# Patient Record
Sex: Female | Born: 2000 | Race: Black or African American | Hispanic: No | Marital: Single | Smoking: Never smoker
Health system: Southern US, Community
[De-identification: ages and names within clinical notes are randomized; demographics above are authoritative.]

## PROBLEM LIST (undated history)

## (undated) ENCOUNTER — Emergency Department (HOSPITAL_COMMUNITY): Payer: Self-pay | Source: Home / Self Care

## (undated) ENCOUNTER — Emergency Department (HOSPITAL_COMMUNITY): Admission: EM | Payer: Self-pay | Source: Home / Self Care

## (undated) DIAGNOSIS — R350 Frequency of micturition: Secondary | ICD-10-CM

## (undated) DIAGNOSIS — H539 Unspecified visual disturbance: Secondary | ICD-10-CM

## (undated) DIAGNOSIS — J45909 Unspecified asthma, uncomplicated: Secondary | ICD-10-CM

## (undated) DIAGNOSIS — R2689 Other abnormalities of gait and mobility: Secondary | ICD-10-CM

## (undated) DIAGNOSIS — L309 Dermatitis, unspecified: Secondary | ICD-10-CM

## (undated) DIAGNOSIS — K219 Gastro-esophageal reflux disease without esophagitis: Secondary | ICD-10-CM

## (undated) DIAGNOSIS — R55 Syncope and collapse: Secondary | ICD-10-CM

## (undated) DIAGNOSIS — E875 Hyperkalemia: Secondary | ICD-10-CM

## (undated) HISTORY — DX: Unspecified visual disturbance: H53.9

## (undated) HISTORY — DX: Other abnormalities of gait and mobility: R26.89

## (undated) HISTORY — DX: Unspecified asthma, uncomplicated: J45.909

## (undated) HISTORY — DX: Frequency of micturition: R35.0

## (undated) HISTORY — DX: Hyperkalemia: E87.5

## (undated) HISTORY — DX: Dermatitis, unspecified: L30.9

## (undated) HISTORY — DX: Syncope and collapse: R55

---
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
## 2018-06-07 ENCOUNTER — Emergency Department (HOSPITAL_COMMUNITY)
Admission: EM | Admit: 2018-06-07 | Discharge: 2018-06-07 | Disposition: A | Discharge status: Home / Self Care | Payer: 59 | Attending: Emergency Medicine | Admitting: Emergency Medicine

## 2018-06-07 ENCOUNTER — Emergency Department (HOSPITAL_COMMUNITY): Payer: 59

## 2018-06-07 ENCOUNTER — Encounter (HOSPITAL_COMMUNITY): Payer: Self-pay | Admitting: Emergency Medicine

## 2018-06-07 ENCOUNTER — Other Ambulatory Visit: Payer: Self-pay

## 2018-06-07 DIAGNOSIS — J45909 Unspecified asthma, uncomplicated: Secondary | ICD-10-CM | POA: Diagnosis not present

## 2018-06-07 DIAGNOSIS — R197 Diarrhea, unspecified: Secondary | ICD-10-CM | POA: Diagnosis present

## 2018-06-07 DIAGNOSIS — E876 Hypokalemia: Secondary | ICD-10-CM | POA: Diagnosis not present

## 2018-06-07 HISTORY — DX: Gastro-esophageal reflux disease without esophagitis: K21.9

## 2018-06-07 LAB — CBC
HEMATOCRIT: 37 % (ref 36.0–46.0)
Hemoglobin: 12.1 g/dL (ref 12.0–15.0)
MCH: 28.5 pg (ref 26.0–34.0)
MCHC: 32.7 g/dL (ref 30.0–36.0)
MCV: 87.1 fL (ref 78.0–100.0)
PLATELETS: 271 10*3/uL (ref 150–400)
RBC: 4.25 MIL/uL (ref 3.87–5.11)
RDW: 13.1 % (ref 11.5–15.5)
WBC: 6.4 10*3/uL (ref 4.0–10.5)

## 2018-06-07 LAB — I-STAT BETA HCG BLOOD, ED (MC, WL, AP ONLY): I-stat hCG, quantitative: <5 m[IU]/mL

## 2018-06-07 LAB — COMPREHENSIVE METABOLIC PANEL
ALT: 35 U/L (ref 14–54)
AST: 24 U/L (ref 15–41)
Albumin: 4.3 g/dL (ref 3.5–5.0)
Alkaline Phosphatase: 71 U/L (ref 38–126)
Anion gap: 9 (ref 5–15)
BUN: 7 mg/dL (ref 6–20)
CHLORIDE: 108 mmol/L (ref 101–111)
CO2: 23 mmol/L (ref 22–32)
Calcium: 8.8 mg/dL — ABNORMAL LOW (ref 8.9–10.3)
Creatinine, Ser: 0.54 mg/dL (ref 0.44–1.00)
GFR calc Af Amer: >60 mL/min (ref >60)
GFR calc non Af Amer: >60 mL/min (ref >60)
Glucose, Bld: 87 mg/dL (ref 65–99)
POTASSIUM: 3.1 mmol/L — AB (ref 3.5–5.1)
Sodium: 140 mmol/L (ref 135–145)
Total Bilirubin: 1 mg/dL (ref 0.3–1.2)
Total Protein: 7.6 g/dL (ref 6.5–8.1)

## 2018-06-07 LAB — LIPASE, BLOOD: Lipase: 25 U/L (ref 11–51)

## 2018-06-07 LAB — URINALYSIS, ROUTINE W REFLEX MICROSCOPIC
Bilirubin Urine: NEGATIVE
Glucose, UA: NEGATIVE mg/dL
Hgb urine dipstick: NEGATIVE
Ketones, ur: NEGATIVE mg/dL
Leukocytes, UA: NEGATIVE
Nitrite: NEGATIVE
Protein, ur: NEGATIVE mg/dL
Specific Gravity, Urine: 1.018 (ref 1.005–1.030)
pH: 5 (ref 5.0–8.0)

## 2018-06-07 LAB — MAGNESIUM: Magnesium: 2.1 mg/dL (ref 1.7–2.4)

## 2018-06-07 MED ORDER — CIPROFLOXACIN HCL 500 MG PO TABS: 500.0000 mg | ORAL_TABLET | Freq: Two times a day (BID) | ORAL | 0 refills | Status: DC

## 2018-06-07 MED ORDER — POTASSIUM CHLORIDE CRYS ER 20 MEQ PO TBCR
40.0000 meq | EXTENDED_RELEASE_TABLET | Freq: Once | ORAL | Status: AC
Start: 2018-06-07 — End: 2018-06-07
  Administered 2018-06-07: 40 meq via ORAL
  Filled 2018-06-07: qty 2

## 2018-06-07 MED ORDER — KETOROLAC TROMETHAMINE 30 MG/ML IJ SOLN
30.0000 mg | Freq: Once | INTRAMUSCULAR | Status: AC
  Administered 2018-06-07: 30 mg via INTRAVENOUS
  Filled 2018-06-07: qty 1

## 2018-06-07 MED ORDER — METRONIDAZOLE 500 MG PO TABS: 500.0000 mg | ORAL_TABLET | Freq: Two times a day (BID) | ORAL | 0 refills | Status: DC

## 2018-06-07 MED ORDER — SODIUM CHLORIDE 0.9 % IV BOLUS
1000.0000 mL | Freq: Once | INTRAVENOUS | Status: AC
  Administered 2018-06-07: 1000 mL via INTRAVENOUS

## 2018-06-07 NOTE — Discharge Instructions (Signed)
Take antibiotics ONLY if diarrhea has not gone away by tomorrow.

## 2018-06-07 NOTE — ED Provider Notes (Signed)
[facility] DEPT Provider Note   CSN: [id number] Arrival date & time: 06/07/18  0944     History   Chief Complaint Chief Complaint  Patient presents with  . Abdominal Pain    HPI Kari Carrillo is a 17 y.o. female.  Pt presents to the ED today with diarrhea and abdominal pain.  The pt said sx started 2 days ago.  She said it is like water is coming out of her bottom.  She tried some pepto bismol which did not help, but her stool was black after taking that.  She c/o mild upper abdominal pain.  She has tolerated po fluids.  Pt also c/o right hip tenderness.     Past Medical History:  Diagnosis Date  . Asthma   . GERD (gastroesophageal reflux disease)     There are no active problems to display for this patient.   History reviewed. No pertinent surgical history.   OB History   None      Home Medications    Prior to Admission medications   Medication Sig Start Date End Date Taking? Authorizing Provider  acetaminophen (TYLENOL) 500 MG tablet Take 1,000 mg by mouth every 6 (six) hours as needed.   Yes [provider]  albuterol (PROVENTIL HFA;VENTOLIN HFA) 108 (90 Base) MCG/ACT inhaler Inhale 1-2 puffs into the lungs every 4 (four) hours as needed for wheezing or shortness of breath. 01/21/16  Yes Harris, Abigail, PA-C  ibuprofen (ADVIL) 200 MG tablet Take 400-600 mg by mouth every 6 (six) hours as needed for moderate pain.   Yes [provider]  ciprofloxacin (CIPRO) 500 MG tablet Take 1 tablet (500 mg total) by mouth 2 (two) times daily. 06/07/18   Isla Pence, MD  EPINEPHrine 0.3 mg/0.3 mL IJ SOAJ injection Inject 0.3 mg into the muscle once as needed (allergic reaction).     [provider]  famotidine (PEPCID) 20 MG tablet Take 1 tablet (20 mg total) by mouth 2 (two) times daily. Patient not taking: Reported on 06/07/2018 09/09/16   Nona Dell, PA-C  guaiFENesin-codeine 100-10 MG/5ML syrup Take  5-10 mLs by mouth every 6 (six) hours as needed for cough. Patient not taking: Reported on 09/09/2016 01/21/16   Margarita Mail, PA-C  metroNIDAZOLE (FLAGYL) 500 MG tablet Take 1 tablet (500 mg total) by mouth 2 (two) times daily. 06/07/18   Isla Pence, MD  predniSONE (DELTASONE) 20 MG tablet 3 tabs po daily x 3 days, then 2 tabs x 3 days, then 1.5 tabs x 3 days, then 1 tab x 3 days, then 0.5 tabs x 3 days Patient not taking: Reported on 09/09/2016 01/21/16   Margarita Mail, PA-C    Family History Family History  Problem Relation Age of Onset  . Diabetes Father   . Multiple sclerosis Father     Social History Social History   Tobacco Use  . Smoking status: Never Smoker  . Smokeless tobacco: Never Used  Substance Use Topics  . Alcohol use: No  . Drug use: No     Allergies   Other and Other   Review of Systems Review of Systems  Gastrointestinal: Positive for abdominal pain and diarrhea.  All other systems reviewed and are negative.    Physical Exam Updated Vital Signs BP 102/67   Pulse 75   Temp 98.6 F (37 C) (Oral)   Resp 18   Ht 5\' 7"  (1.702 m)   Wt 81.6 kg (180 lb)   LMP  05/17/2018 (Exact Date)   SpO2 100%   BMI 28.19 kg/m   Physical Exam  Constitutional: She is oriented to person, place, and time. She appears well-developed and well-nourished.  HENT:  Head: Normocephalic and atraumatic.  Mouth/Throat: Mucous membranes are dry.  Eyes: Pupils are equal, round, and reactive to light. EOM are normal.  Cardiovascular: Normal rate and regular rhythm.  Pulmonary/Chest: Effort normal and breath sounds normal.  Abdominal: Normal appearance, normal aorta and bowel sounds are normal. There is tenderness in the epigastric area.  Musculoskeletal:       Legs: Neurological: She is alert and oriented to person, place, and time.  Skin: Skin is warm. Capillary refill takes less than 2 seconds.  Psychiatric: She has a normal mood and affect. Her behavior is normal.    Nursing note and vitals reviewed.    ED Treatments / Results  Labs (all labs ordered are listed, but only abnormal results are displayed) Labs Reviewed  COMPREHENSIVE METABOLIC PANEL - Abnormal; Notable for the following components:      Result Value   Potassium 3.1 (*)    Calcium 8.8 (*)    All other components within normal limits  GASTROINTESTINAL PANEL BY PCR, STOOL (REPLACES STOOL CULTURE)  C DIFFICILE QUICK SCREEN W PCR REFLEX  LIPASE, BLOOD  CBC  URINALYSIS, ROUTINE W REFLEX MICROSCOPIC  MAGNESIUM  I-STAT BETA HCG BLOOD, ED ([facility], [facility], [facility] ONLY)    EKG None  Radiology Dg Hip Unilat W Or Wo Pelvis 2-3 Views Right  Result Date: 06/07/2018 CLINICAL DATA:  Awoke this morning with lateral RIGHT hip pain, new EXAM: DG HIP (WITH OR WITHOUT PELVIS) 2-3V RIGHT COMPARISON:  None FINDINGS: Osseous mineralization normal. Hip and SI joint spaces preserved. No acute fracture, dislocation, or bone destruction. IMPRESSION: No acute osseous abnormalities. Electronically Signed   By: Lavonia Dana M.D.   On: 06/07/2018 14:01    Procedures Procedures (including critical care time)  Medications Ordered in ED Medications  sodium chloride 0.9 % bolus 1,000 mL (0 mLs Intravenous Stopped 06/07/18 1359)  potassium chloride SA (K-DUR,KLOR-CON) CR tablet 40 mEq (40 mEq Oral Given 06/07/18 1255)  ketorolac (TORADOL) 30 MG/ML injection 30 mg (30 mg Intravenous Given 06/07/18 1336)     Initial Impression / Assessment and Plan / ED Course  I have reviewed the triage vital signs and the nursing notes.  Pertinent labs & imaging results that were available during my care of the patient were reviewed by me and considered in my medical decision making (see chart for details).    Pt is feeling much better after IVFs and toradol.  She knows to return if worse.  Final Clinical Impressions(s) / ED Diagnoses   Final diagnoses:  Diarrhea, unspecified type  Hypokalemia    ED Discharge Orders         Ordered    metroNIDAZOLE (FLAGYL) 500 MG tablet  2 times daily     06/07/18 1447    ciprofloxacin (CIPRO) 500 MG tablet  2 times daily     06/07/18 1447       Isla Pence, MD 06/07/18 1449

## 2018-06-07 NOTE — ED Notes (Signed)
Removed IV.  Was not documented.

## 2018-06-07 NOTE — ED Triage Notes (Signed)
Pt reports a 2 days hx of frequent dark, loose, liquid stools. C/o upper abdominal cramping.Denies NV. Tx with Pepto-Bismol, Prilosec last night. Pt stated that she just finished a ham and cheese biscuit prior to checking in to be seen. . Drinking water today. Gatorade yesterday.Pt reports that she does not have a PCP. Mother art bedside.

## 2018-06-22 ENCOUNTER — Ambulatory Visit: Payer: 59 | Admitting: Certified Nurse Midwife

## 2018-06-22 ENCOUNTER — Encounter: Payer: Self-pay | Admitting: Certified Nurse Midwife

## 2018-06-22 ENCOUNTER — Other Ambulatory Visit: Payer: Self-pay

## 2018-06-22 VITALS — BP 110/68 | HR 74 | Resp 14 | Ht 67.0 in | Wt 184.0 lb

## 2018-06-22 DIAGNOSIS — Z01419 Encounter for gynecological examination (general) (routine) without abnormal findings: Secondary | ICD-10-CM

## 2018-06-22 DIAGNOSIS — N898 Other specified noninflammatory disorders of vagina: Secondary | ICD-10-CM | POA: Diagnosis not present

## 2018-06-22 DIAGNOSIS — B379 Candidiasis, unspecified: Secondary | ICD-10-CM

## 2018-06-22 DIAGNOSIS — T3695XA Adverse effect of unspecified systemic antibiotic, initial encounter: Secondary | ICD-10-CM | POA: Diagnosis not present

## 2018-06-22 MED ORDER — FLUCONAZOLE 150 MG PO TABS: ORAL_TABLET | ORAL | 0 refills | Status: DC

## 2018-06-22 MED ORDER — NYSTATIN-TRIAMCINOLONE 100000-0.1 UNIT/GM-% EX OINT: TOPICAL_OINTMENT | CUTANEOUS | 0 refills | Status: DC

## 2018-06-22 NOTE — Progress Notes (Signed)
17 y.o. Single African American female [id number] here to establish care for problem  with complaint of vaginal symptoms of itching, slight burning,swelling of vaginal area Periods normal, monthly uses pads only.. Treated for stomach issues at [facility] and given Cipro and Flagyl.  Developed vaginal symptoms after use. Mother( patient here) with patient, patient consents to presence for exam and any conversation and management. Patient never sexually active. Onset of symptoms about one week ago. Used epsom salt bath which has helped.Marland Kitchen Describes discharge as white, ? Odor... Denies new personal products. Patient concerned about first gyn exam, declines speculum use, unless absolutely necessary. Uses pad for periods only. Denies any urinary symptoms, except urine burns skin when touches. Contraception is none. Not sexually active ever.  Review of Systems  Constitutional: Negative for chills and fever.  Gastrointestinal: Negative for blood in stool, constipation, diarrhea, nausea and vomiting.  Genitourinary: Negative for dysuria, frequency, hematuria and urgency.       Only burning with urine touches skin  Skin: Positive for itching. Negative for rash.       Vaginal itching  Neurological: Negative for dizziness and headaches.  Psychiatric/Behavioral: The patient is nervous/anxious.        New patient nervous    O:Healthy female WDWN Affect: normal, orientation x 3  Exam: skin: warm and dryn Abdomen: soft,non tender, no masses  Inguinal Lymph nodes: no enlargement or tenderness Pelvic exam: External genital: normal female, with exudate and scaling noted, wet prep taken BUS: negative Vagina: virginal with white  discharge noted at entrance to vagina. Ph 4.0:   ,Wet prep taken, Affirm taken Cervix: normal, non tender, no CMT with palpation only, no speculum used Uterus: normal, non tender Adnexa:normal, non tender, no masses or fullness noted   Wet Prep results: + yeast vaginal/external smear  KOH, Saline  A:Normal pelvic exam with no speculum exam performed Yeast vaginitis/vulvitis suspect antibiotic induced   P:Discussed findings of yeast vaginitis/vulvitis and etiology. Discussed Aveeno or baking soda sitz bath for comfort. Avoid moist clothes or pads for extended period of time. If working out in gym clothes or swim suits for long periods of time change underwear or bottoms of swimsuit if possible. Marland Kitchen Rx: Diflucan see order with instructions Rx: Mycolog ointment see order with instructions  Rv prn

## 2018-06-22 NOTE — Patient Instructions (Signed)

## 2018-06-23 LAB — VAGINITIS/VAGINOSIS, DNA PROBE
Candida Species: POSITIVE — AB
GARDNERELLA VAGINALIS: NEGATIVE
Trichomonas vaginosis: NEGATIVE

## 2018-07-19 ENCOUNTER — Encounter (HOSPITAL_COMMUNITY): Payer: Self-pay

## 2018-07-19 ENCOUNTER — Emergency Department (HOSPITAL_COMMUNITY): Payer: 59

## 2018-07-19 ENCOUNTER — Emergency Department (HOSPITAL_COMMUNITY)
Admission: EM | Admit: 2018-07-19 | Discharge: 2018-07-20 | Disposition: A | Discharge status: Home / Self Care | Payer: 59 | Attending: Emergency Medicine | Admitting: Emergency Medicine

## 2018-07-19 DIAGNOSIS — R110 Nausea: Secondary | ICD-10-CM | POA: Diagnosis not present

## 2018-07-19 DIAGNOSIS — W19XXXA Unspecified fall, initial encounter: Secondary | ICD-10-CM | POA: Insufficient documentation

## 2018-07-19 DIAGNOSIS — S0990XA Unspecified injury of head, initial encounter: Secondary | ICD-10-CM | POA: Insufficient documentation

## 2018-07-19 DIAGNOSIS — Y999 Unspecified external cause status: Secondary | ICD-10-CM | POA: Diagnosis not present

## 2018-07-19 DIAGNOSIS — Y939 Activity, unspecified: Secondary | ICD-10-CM | POA: Insufficient documentation

## 2018-07-19 DIAGNOSIS — Z79899 Other long term (current) drug therapy: Secondary | ICD-10-CM | POA: Diagnosis not present

## 2018-07-19 DIAGNOSIS — S060X0A Concussion without loss of consciousness, initial encounter: Secondary | ICD-10-CM

## 2018-07-19 DIAGNOSIS — Y929 Unspecified place or not applicable: Secondary | ICD-10-CM | POA: Insufficient documentation

## 2018-07-19 DIAGNOSIS — J45909 Unspecified asthma, uncomplicated: Secondary | ICD-10-CM | POA: Diagnosis not present

## 2018-07-19 LAB — PREGNANCY, URINE: PREG TEST UR: NEGATIVE

## 2018-07-19 MED ORDER — ACETAMINOPHEN 325 MG PO TABS
650.0000 mg | ORAL_TABLET | Freq: Once | ORAL | Status: AC
  Administered 2018-07-19: 650 mg via ORAL
  Filled 2018-07-19: qty 2

## 2018-07-19 MED ORDER — METOCLOPRAMIDE HCL 10 MG PO TABS: 10.0000 mg | ORAL_TABLET | Freq: Four times a day (QID) | ORAL | 0 refills | Status: DC | PRN

## 2018-07-19 NOTE — ED Provider Notes (Signed)
[facility] DEPT Provider Note   CSN: [id number] Arrival date & time: 07/19/18  1727     History   Chief Complaint Chief Complaint  Patient presents with  . Fall    HPI Rayme Bui is a 17 y.o. female hx of asthma, GERD, here presenting with dizziness, headaches, trouble sleeping after head injury.  Patient states that she had a mechanical fall 5 days ago and fell onto the right side and hit her head.  Since then, she has some intermittent nausea and trouble sleeping.  Occasional headaches as well.  Patient denies vomiting or passing out or trouble walking.  Patient states that she thought the symptoms would go away but has persistent symptoms for is here for evaluation.  She is otherwise healthy and not on blood thinners.   The history is provided by the patient.    Past Medical History:  Diagnosis Date  . Asthma   . GERD (gastroesophageal reflux disease)     Patient Active Problem List   Diagnosis Date Noted  . Gastroesophageal reflux disease without esophagitis 08/05/2015  . Irritable bowel syndrome with diarrhea 08/05/2015    History reviewed. No pertinent surgical history.   OB History    Gravida  0   Para  0   Term  0   Preterm  0   AB  0   Living  0     SAB  0   TAB  0   Ectopic  0   Multiple  0   Live Births  0            Home Medications    Prior to Admission medications   Medication Sig Start Date End Date Taking? Authorizing Provider  albuterol (PROVENTIL HFA;VENTOLIN HFA) 108 (90 Base) MCG/ACT inhaler Inhale 1-2 puffs into the lungs every 4 (four) hours as needed for wheezing or shortness of breath. 01/21/16  Yes Harris, Abigail, PA-C  beclomethasone (QVAR) 40 MCG/ACT inhaler Inhale 1 puff into the lungs 2 (two) times daily.   Yes [provider]  EPINEPHrine 0.3 mg/0.3 mL IJ SOAJ injection Inject 0.3 mg into the muscle once as needed (allergic reaction).    Yes [provider]    loratadine (CLARITIN) 10 MG tablet Take 10 mg by mouth daily as needed for allergies.   Yes [provider]  famotidine (PEPCID) 20 MG tablet Take 1 tablet (20 mg total) by mouth 2 (two) times daily. Patient not taking: Reported on 07/19/2018 09/09/16   Nona Dell, PA-C  fluconazole (DIFLUCAN) 150 MG tablet Take one tablet.  Repeat one tablet in 5 days Patient not taking: Reported on 07/19/2018 06/22/18   Regina Eck, CNM  nystatin-triamcinolone ointment [facility]) Apply thinly to external skin twice daily for 5 days Patient not taking: Reported on 07/19/2018 06/22/18   Regina Eck, CNM  predniSONE (DELTASONE) 20 MG tablet 3 tabs po daily x 3 days, then 2 tabs x 3 days, then 1.5 tabs x 3 days, then 1 tab x 3 days, then 0.5 tabs x 3 days Patient not taking: Reported on 07/19/2018 01/21/16   Margarita Mail, PA-C    Family History Family History  Problem Relation Age of Onset  . Diabetes Father   . Multiple sclerosis Father   . Diabetes Paternal Grandfather     Social History Social History   Tobacco Use  . Smoking status: Never Smoker  . Smokeless tobacco: Never Used  Substance Use Topics  .  Alcohol use: No  . Drug use: No     Allergies   Other and Other   Review of Systems Review of Systems  Neurological: Positive for dizziness and headaches.  All other systems reviewed and are negative.    Physical Exam Updated Vital Signs BP 113/80 (BP Location: Right Arm)   Pulse 82   Resp 16   LMP 06/19/2018 (Approximate)   SpO2 100%   Physical Exam  Constitutional: She is oriented to person, place, and time. She appears well-developed and well-nourished.  HENT:  Head: Normocephalic.  ? Small contusion R temporal area   Eyes: Pupils are equal, round, and reactive to light. Conjunctivae and EOM are normal.  Neck: Normal range of motion. Neck supple.  Cardiovascular: Normal rate and regular rhythm.  Pulmonary/Chest: Effort normal and breath  sounds normal.  Abdominal: Soft. Bowel sounds are normal. She exhibits no distension. There is no tenderness.  Musculoskeletal: Normal range of motion.  Neurological: She is alert and oriented to person, place, and time. No cranial nerve deficit. Coordination normal.  CN 2- 12 intact, nl strength throughout, nl sensation. Nl gait   Skin: Skin is warm.  Psychiatric: She has a normal mood and affect.  Nursing note and vitals reviewed.    ED Treatments / Results  Labs (all labs ordered are listed, but only abnormal results are displayed) Labs Reviewed  PREGNANCY, URINE    EKG None  Radiology No results found.  Procedures Procedures (including critical care time)  Medications Ordered in ED Medications  acetaminophen (TYLENOL) tablet 650 mg (has no administration in time range)     Initial Impression / Assessment and Plan / ED Course  I have reviewed the triage vital signs and the nursing notes.  Pertinent labs & imaging results that were available during my care of the patient were reviewed by me and considered in my medical decision making (see chart for details).     Chandel Zaun is a 17 y.o. female here with dizziness, nausea, headaches after head injury 5 days ago. Nl neuro exam now. I think likely mild concussion after head injury. Offered CT vs close observation and she states that since she is still having symptoms, requesting CT.  11:21 PM CT head unremarkable. UCG neg. Likely mild concussion. Will dc home.     Final Clinical Impressions(s) / ED Diagnoses   Final diagnoses:  None    ED Discharge Orders    None       Drenda Freeze, MD 07/19/18 2321

## 2018-07-19 NOTE — ED Triage Notes (Signed)
Pt reports a head injury approx 5 days ago, was not seen after hitting her head but reports that now she has been having trouble sleeping, nausea, and headache. Pt is in no distress.

## 2018-07-19 NOTE — Discharge Instructions (Signed)
I prescribed reglan to your pharmacy. Take it as needed for nausea or headaches.   Take tylenol, motrin for headaches.   See your doctor  Return to ER if you have worse headaches, nausea, vomiting, weakness.

## 2018-07-29 ENCOUNTER — Encounter

## 2018-07-29 ENCOUNTER — Ambulatory Visit: Payer: 59 | Admitting: Obstetrics & Gynecology

## 2018-07-29 ENCOUNTER — Other Ambulatory Visit: Payer: Self-pay

## 2018-07-29 ENCOUNTER — Encounter: Payer: Self-pay | Admitting: Obstetrics & Gynecology

## 2018-07-29 VITALS — BP 108/60 | HR 84 | Resp 16 | Ht 67.25 in | Wt 185.6 lb

## 2018-07-29 DIAGNOSIS — Z01419 Encounter for gynecological examination (general) (routine) without abnormal findings: Secondary | ICD-10-CM | POA: Diagnosis not present

## 2018-07-29 NOTE — Progress Notes (Signed)
17 y.o. G0P0000 SingleAfrican AmericanF here for annual exam.  Starting at [facility] [employer].  Leaving on Sunday for band camp.  Plays the clarinet.  Menstrual cycles are monthly.  Flow lasts 3 -7 days.  Occasionally has cycles that are heavy.  Occasionally passes small clots.  Sometimes does have cramping and does take tylenol or Advil if needed.     Patient's last menstrual period was 07/09/2018 (exact date).          Sexually active: No.  The current method of family planning is abstinence.    Exercising: Yes.    swimming, running Smoker:  no  Health Maintenance: Pap:  Never Gardasil: No TDaP:  Unsure  Screening Labs: If needed    reports that she has never smoked. She has never used smokeless tobacco. She reports that she does not drink alcohol or use drugs.  Past Medical History:  Diagnosis Date  . Asthma   . GERD (gastroesophageal reflux disease)     History reviewed. No pertinent surgical history.  Current Outpatient Medications  Medication Sig Dispense Refill  . albuterol (PROVENTIL HFA;VENTOLIN HFA) 108 (90 Base) MCG/ACT inhaler Inhale 1-2 puffs into the lungs every 4 (four) hours as needed for wheezing or shortness of breath. 1 Inhaler 0  . beclomethasone (QVAR) 40 MCG/ACT inhaler Inhale 1 puff into the lungs 2 (two) times daily.    Marland Kitchen EPINEPHrine 0.3 mg/0.3 mL IJ SOAJ injection Inject 0.3 mg into the muscle once as needed (allergic reaction).     . famotidine (PEPCID) 20 MG tablet Take 1 tablet (20 mg total) by mouth 2 (two) times daily. 30 tablet 0  . loratadine (CLARITIN) 10 MG tablet Take 10 mg by mouth daily as needed for allergies.    Marland Kitchen metoCLOPramide (REGLAN) 10 MG tablet Take 1 tablet (10 mg total) by mouth every 6 (six) hours as needed for nausea (nausea/headache). 8 tablet 0  . Multiple Vitamins-Minerals (SUPER THERA VITE M) TABS Take by mouth daily.    Marland Kitchen nystatin-triamcinolone ointment (MYCOLOG) Apply thinly to external skin twice daily for 5 days 30 g 0  .  predniSONE (DELTASONE) 20 MG tablet 3 tabs po daily x 3 days, then 2 tabs x 3 days, then 1.5 tabs x 3 days, then 1 tab x 3 days, then 0.5 tabs x 3 days 27 tablet 0   No current facility-administered medications for this visit.     Family History  Problem Relation Age of Onset  . Diabetes Father   . Multiple sclerosis Father   . Diabetes Paternal Grandmother     Review of Systems  All other systems reviewed and are negative.   Exam:   BP 108/60 (BP Location: Right Arm, Patient Position: Sitting, Cuff Size: Large)   Pulse 84   Resp 16   Ht 5' 7.25" (1.708 m)   Wt 185 lb 9.6 oz (84.2 kg)   LMP 07/09/2018 (Exact Date)   BMI 28.85 kg/m    Height: 5' 7.25" (170.8 cm)  Ht Readings from Last 3 Encounters:  07/29/18 5' 7.25" (1.708 m) (88 %, Z= 1.17)*  06/22/18 5\' 7"  (1.702 m) (86 %, Z= 1.07)*  06/07/18 5\' 7"  (1.702 m) (86 %, Z= 1.07)*   * Growth percentiles are based on CDC (Girls, 2-20 Years) data.    General appearance: alert, cooperative and appears stated age Head: Normocephalic, without obvious abnormality, atraumatic Neck: no adenopathy, supple, symmetrical, trachea midline and thyroid normal to inspection and palpation Lungs: clear to auscultation  bilaterally Breasts: normal appearance, no masses or tenderness Heart: regular rate and rhythm Abdomen: soft, non-tender; bowel sounds normal; no masses,  no organomegaly Extremities: extremities normal, atraumatic, no cyanosis or edema Skin: Skin color, texture, turgor normal. No rashes or lesions Lymph nodes: Cervical, supraclavicular, and axillary nodes normal. No abnormal inguinal nodes palpated Neurologic: Grossly normal  Pelvic: No pelvic exam performed.  A:  Well Woman with normal exam  P:   CBE performed today.  Mammogram indicated at age 35, currently. No pap smear indicated.  Will start at age 28. Pt aware to call with any new concerns/issues.  Privacy reviewed with pt and her mother.  Both understand and  questions welcomed/answered.

## 2020-02-27 ENCOUNTER — Other Ambulatory Visit: Payer: Self-pay

## 2020-02-27 ENCOUNTER — Encounter (HOSPITAL_COMMUNITY): Payer: Self-pay

## 2020-02-27 ENCOUNTER — Emergency Department (HOSPITAL_COMMUNITY)
Admission: EM | Admit: 2020-02-27 | Discharge: 2020-02-27 | Disposition: A | Discharge status: Home / Self Care | Payer: Managed Care, Other (non HMO) | Attending: Emergency Medicine | Admitting: Emergency Medicine

## 2020-02-27 DIAGNOSIS — R519 Headache, unspecified: Secondary | ICD-10-CM | POA: Insufficient documentation

## 2020-02-27 DIAGNOSIS — R55 Syncope and collapse: Secondary | ICD-10-CM | POA: Diagnosis not present

## 2020-02-27 DIAGNOSIS — Z79899 Other long term (current) drug therapy: Secondary | ICD-10-CM | POA: Diagnosis not present

## 2020-02-27 LAB — BASIC METABOLIC PANEL
Anion gap: 11 (ref 5–15)
BUN: 8 mg/dL (ref 6–20)
CO2: 20 mmol/L — ABNORMAL LOW (ref 22–32)
Calcium: 9.2 mg/dL (ref 8.9–10.3)
Chloride: 107 mmol/L (ref 98–111)
Creatinine, Ser: 0.77 mg/dL (ref 0.44–1.00)
GFR calc Af Amer: >60 mL/min (ref >60)
GFR calc non Af Amer: >60 mL/min (ref >60)
Glucose, Bld: 81 mg/dL (ref 70–99)
Potassium: 6 mmol/L — ABNORMAL HIGH (ref 3.5–5.1)
Sodium: 138 mmol/L (ref 135–145)

## 2020-02-27 LAB — CBC
HCT: 41.2 % (ref 36.0–46.0)
Hemoglobin: 12.6 g/dL (ref 12.0–15.0)
MCH: 28.7 pg (ref 26.0–34.0)
MCHC: 30.6 g/dL (ref 30.0–36.0)
MCV: 93.8 fL (ref 80.0–100.0)
Platelets: 198 10*3/uL (ref 150–400)
RBC: 4.39 MIL/uL (ref 3.87–5.11)
RDW: 13.2 % (ref 11.5–15.5)
WBC: 9.9 10*3/uL (ref 4.0–10.5)
nRBC: 0 % (ref 0.0–0.2)

## 2020-02-27 LAB — URINALYSIS, ROUTINE W REFLEX MICROSCOPIC
Bacteria, UA: NONE SEEN
Bilirubin Urine: NEGATIVE
Glucose, UA: NEGATIVE mg/dL
Hgb urine dipstick: NEGATIVE
Ketones, ur: NEGATIVE mg/dL
Leukocytes,Ua: NEGATIVE
Nitrite: NEGATIVE
Protein, ur: 30 mg/dL — AB
Specific Gravity, Urine: 1.019 (ref 1.005–1.030)
pH: 5 (ref 5.0–8.0)

## 2020-02-27 LAB — PREGNANCY, URINE: Preg Test, Ur: NEGATIVE

## 2020-02-27 MED ORDER — ACETAMINOPHEN 500 MG PO TABS
1000.0000 mg | ORAL_TABLET | Freq: Once | ORAL | Status: AC
  Administered 2020-02-27: 1000 mg via ORAL
  Filled 2020-02-27: qty 2

## 2020-02-27 NOTE — ED Notes (Signed)
Patient verbalizes understanding of discharge instructions. Opportunity for questioning and answers were provided. Armband removed by staff, pt discharged from ED to home with parents. Ambulates without dizziness. Tolerted fluids and meal prior to dc.

## 2020-02-27 NOTE — Discharge Instructions (Addendum)
It was our pleasure to provide your ER care today - we hope that you feel better.  Rest. Drink plenty of fluids. Eat meals regularly, and do not skip or delay meals.  Follow up with primary care doctor in the coming week.   Return to ER right away if worse, symptoms recur, fainting, chest pain, trouble breathing, persistent fast heart beating, fevers, new or severe pain, or other concern.

## 2020-02-27 NOTE — ED Triage Notes (Signed)
Pt from home with ems for near syncopal episode while washing dishes, initial BP 64/48 and states she had a hard time answering questions when EMS arrived. Pt was having some nausea and a headache at the time, denies at this time. Pt only c.o generalized abd pain. BP came up to 99/62 upon arrival to ED. Pt alert and oriented.

## 2020-02-27 NOTE — ED Provider Notes (Addendum)
[facility] EMERGENCY DEPARTMENT Provider Note   CSN: [id number] Arrival date & time: 02/27/20  1215     History Chief Complaint  Patient presents with  . Near Syncope  . Headache  . Abdominal Pain    Kari Carrillo is a 19 y.o. female.  Patient c/o near syncope just pta today at home while doing dishes. States was standing, and felt faint, lightheaded, as if about to pass out. Family member was able to walk her to a chair. No loc. No associated chest pain or discomfort. No sob or unusual doe. No palpitations or sense of rapid or irregular heartbeat. Denies hx syncope, but states felt faint once before during her period. lnmp 3 weeks ago. No recent blood loss, rectal bleeding, vaginal bleeding, or melena. No recent change in meds. No fever or chills. Has intermittent dull frontal headaches, c/w prior. No acute, abrupt or severe head pain. No change in speech or vision. No numbness/weakness. Pt had not eaten/drank today. Currently feels improved.   The history is provided by the patient.  Near Syncope Associated symptoms include abdominal pain and headaches. Pertinent negatives include no chest pain and no shortness of breath.  Headache Associated symptoms: abdominal pain and near-syncope   Associated symptoms: no back pain, no fever, no neck pain, no numbness, no sore throat and no vomiting   Abdominal Pain Associated symptoms: no chest pain, no chills, no dysuria, no fever, no shortness of breath, no sore throat, no vaginal bleeding and no vomiting        Past Medical History:  Diagnosis Date  . Allergy-induced asthma   . GERD (gastroesophageal reflux disease)     Patient Active Problem List   Diagnosis Date Noted  . Gastroesophageal reflux disease without esophagitis 08/05/2015  . Irritable bowel syndrome with diarrhea 08/05/2015    History reviewed. No pertinent surgical history.   OB History    Gravida  0   Para  0   Term  0   Preterm  0   AB    0   Living  0     SAB  0   TAB  0   Ectopic  0   Multiple  0   Live Births  0           Family History  Problem Relation Age of Onset  . Diabetes Father   . Multiple sclerosis Father   . Diabetes Paternal Grandmother     Social History   Tobacco Use  . Smoking status: Never Smoker  . Smokeless tobacco: Never Used  Substance Use Topics  . Alcohol use: No  . Drug use: No    Home Medications Prior to Admission medications   Medication Sig Start Date End Date Taking? Authorizing Provider  albuterol (PROVENTIL HFA;VENTOLIN HFA) 108 (90 Base) MCG/ACT inhaler Inhale 1-2 puffs into the lungs every 4 (four) hours as needed for wheezing or shortness of breath. 01/21/16   Harris, Vernie Shanks, PA-C  beclomethasone (QVAR) 40 MCG/ACT inhaler Inhale 1 puff into the lungs 2 (two) times daily.    [provider]  EPINEPHrine 0.3 mg/0.3 mL IJ SOAJ injection Inject 0.3 mg into the muscle once as needed (allergic reaction).     [provider]  famotidine (PEPCID) 20 MG tablet Take 1 tablet (20 mg total) by mouth 2 (two) times daily. 09/09/16   Nona Dell, PA-C  loratadine (CLARITIN) 10 MG tablet Take 10 mg by mouth daily as needed for allergies.  [provider]  metoCLOPramide (REGLAN) 10 MG tablet Take 1 tablet (10 mg total) by mouth every 6 (six) hours as needed for nausea (nausea/headache). 07/19/18   Drenda Freeze, MD  Multiple Vitamins-Minerals (SUPER THERA VITE M) TABS Take by mouth daily.    [provider]  nystatin-triamcinolone ointment Lilyan Gilford) Apply thinly to external skin twice daily for 5 days 06/22/18   Regina Eck, CNM  predniSONE (DELTASONE) 20 MG tablet 3 tabs po daily x 3 days, then 2 tabs x 3 days, then 1.5 tabs x 3 days, then 1 tab x 3 days, then 0.5 tabs x 3 days 01/21/16   Margarita Mail, PA-C    Allergies    Other and Other  Review of Systems   Review of Systems  Constitutional: Negative for chills  and fever.  HENT: Negative for sore throat.   Eyes: Negative for visual disturbance.  Respiratory: Negative for shortness of breath.   Cardiovascular: Positive for near-syncope. Negative for chest pain.  Gastrointestinal: Positive for abdominal pain. Negative for blood in stool and vomiting.  Endocrine: Negative for polyuria.  Genitourinary: Negative for dysuria, flank pain and vaginal bleeding.  Musculoskeletal: Negative for back pain and neck pain.  Skin: Negative for rash.  Neurological: Positive for light-headedness and headaches. Negative for speech difficulty and numbness.  Hematological: Does not bruise/bleed easily.  Psychiatric/Behavioral: Negative for confusion.    Physical Exam Updated Vital Signs BP 103/76   Pulse 77   Temp 98.4 F (36.9 C) (Oral)   Resp 16   Ht 1.702 m (5\' 7" )   Wt 81.2 kg   LMP 02/06/2020   SpO2 100%   BMI 28.04 kg/m   Physical Exam Vitals and nursing note reviewed.  Constitutional:      Appearance: Normal appearance. She is well-developed.  HENT:     Head: Atraumatic.     Nose: Nose normal.     Mouth/Throat:     Mouth: Mucous membranes are moist.  Eyes:     General: No scleral icterus.    Extraocular Movements: Extraocular movements intact.     Conjunctiva/sclera: Conjunctivae normal.     Pupils: Pupils are equal, round, and reactive to light.  Neck:     Vascular: No carotid bruit.     Trachea: No tracheal deviation.     Comments: Thyroid not grossly enlarged or tender. Trachea midline.  Cardiovascular:     Rate and Rhythm: Normal rate and regular rhythm.     Pulses: Normal pulses.     Heart sounds: Normal heart sounds. No murmur. No friction rub. No gallop.   Pulmonary:     Effort: Pulmonary effort is normal. No respiratory distress.     Breath sounds: Normal breath sounds.  Abdominal:     General: Bowel sounds are normal. There is no distension.     Palpations: Abdomen is soft.     Tenderness: There is no abdominal tenderness.  There is no guarding.  Genitourinary:    Comments: No cva tenderness.  Musculoskeletal:        General: No swelling or tenderness.     Cervical back: Normal range of motion and neck supple. No rigidity. No muscular tenderness.     Right lower leg: No edema.     Left lower leg: No edema.  Skin:    General: Skin is warm and dry.     Findings: No rash.  Neurological:     Mental Status: She is alert.  Comments: Alert, speech normal. Motor intact bil, stre 5/5. No pronator drift. sens grossly intact. Steady gait.   Psychiatric:        Mood and Affect: Mood normal.     ED Results / Procedures / Treatments   Labs (all labs ordered are listed, but only abnormal results are displayed) Results for orders placed or performed during the hospital encounter of [id number]  Basic metabolic panel  Result Value Ref Range   Sodium 138 135 - 145 mmol/L   Potassium 6.0 (H) 3.5 - 5.1 mmol/L   Chloride 107 98 - 111 mmol/L   CO2 20 (L) 22 - 32 mmol/L   Glucose, Bld 81 70 - 99 mg/dL   BUN 8 6 - 20 mg/dL   Creatinine, Ser 0.77 0.44 - 1.00 mg/dL   Calcium 9.2 8.9 - 10.3 mg/dL   GFR calc non Af Amer >60 >60 mL/min   GFR calc Af Amer >60 >60 mL/min   Anion gap 11 5 - 15  CBC  Result Value Ref Range   WBC 9.9 4.0 - 10.5 K/uL   RBC 4.39 3.87 - 5.11 MIL/uL   Hemoglobin 12.6 12.0 - 15.0 g/dL   HCT 41.2 36.0 - 46.0 %   MCV 93.8 80.0 - 100.0 fL   MCH 28.7 26.0 - 34.0 pg   MCHC 30.6 30.0 - 36.0 g/dL   RDW 13.2 11.5 - 15.5 %   Platelets 198 150 - 400 K/uL   nRBC 0.0 0.0 - 0.2 %  Urinalysis, Routine w reflex microscopic  Result Value Ref Range   Color, Urine YELLOW YELLOW   APPearance HAZY (A) CLEAR   Specific Gravity, Urine 1.019 1.005 - 1.030   pH 5.0 5.0 - 8.0   Glucose, UA NEGATIVE NEGATIVE mg/dL   Hgb urine dipstick NEGATIVE NEGATIVE   Bilirubin Urine NEGATIVE NEGATIVE   Ketones, ur NEGATIVE NEGATIVE mg/dL   Protein, ur 30 (A) NEGATIVE mg/dL   Nitrite NEGATIVE NEGATIVE   Leukocytes,Ua  NEGATIVE NEGATIVE   RBC / HPF 0-5 0 - 5 RBC/hpf   WBC, UA 0-5 0 - 5 WBC/hpf   Bacteria, UA NONE SEEN NONE SEEN   Squamous Epithelial / LPF 0-5 0 - 5   Mucus PRESENT    Hyaline Casts, UA PRESENT   Pregnancy, urine  Result Value Ref Range   Preg Test, Ur NEGATIVE NEGATIVE    EKG EKG Interpretation  Date/Time:  Tuesday February 27 2020 12:17:57 EST Ventricular Rate:  76 PR Interval:  152 QRS Duration: 84 QT Interval:  386 QTC Calculation: 434 R Axis:   57 Text Interpretation: Normal sinus rhythm Nonspecific ST abnormality No previous tracing Confirmed by Lajean Saver [phone]) on 02/27/2020 12:40:35 PM   Radiology No results found.  Procedures Procedures (including critical care time)  Medications Ordered in ED Medications - No data to display  ED Course  I have reviewed the triage vital signs and the nursing notes.  Pertinent labs & imaging results that were available during my care of the patient were reviewed by me and considered in my medical decision making (see chart for details).    MDM Rules/Calculators/A&P                      Labs sent. Cardiac monitoring, ecg. Po fluids/food.  Reviewed nursing notes and prior charts for additional history.   Labs reviewed/interpreted by me - u preg neg, no uti, hgb normal.   Recheck pt, sinus rhythm, no faintness  or dizziness.  Patient had not eaten/drank today - pt provided sandwich/food and po fluids.   Patient indicates feels fine, no faintness or dizziness. Ambulatory in ED, no current symptoms.   Patient appears stable for d/c. Discussed w pt whether she wanted me to call/update mother - she indicates she has been in communication with her father, and that she will update her parents.   Rec pcp f/u 1 week.   Return precautions provided.    Final Clinical Impression(s) / ED Diagnoses Final diagnoses:  None    Rx / DC Orders ED Discharge Orders    None         Lajean Saver, MD 02/27/20 1610

## 2020-03-13 ENCOUNTER — Encounter: Payer: Self-pay | Admitting: Certified Nurse Midwife

## 2020-04-08 ENCOUNTER — Other Ambulatory Visit: Payer: Self-pay | Admitting: Family Medicine

## 2020-04-08 DIAGNOSIS — Z820 Family history of epilepsy and other diseases of the nervous system: Secondary | ICD-10-CM

## 2020-04-08 DIAGNOSIS — H539 Unspecified visual disturbance: Secondary | ICD-10-CM

## 2020-04-08 DIAGNOSIS — R55 Syncope and collapse: Secondary | ICD-10-CM

## 2020-04-08 DIAGNOSIS — R350 Frequency of micturition: Secondary | ICD-10-CM

## 2020-04-19 ENCOUNTER — Ambulatory Visit: Payer: Managed Care, Other (non HMO) | Admitting: Physician Assistant

## 2020-04-25 ENCOUNTER — Other Ambulatory Visit: Payer: Self-pay

## 2020-04-25 ENCOUNTER — Ambulatory Visit
Admission: RE | Admit: 2020-04-25 | Discharge: 2020-04-25 | Disposition: A | Discharge status: Home / Self Care | Payer: Managed Care, Other (non HMO) | Source: Ambulatory Visit | Attending: Family Medicine | Admitting: Family Medicine

## 2020-04-25 DIAGNOSIS — Z820 Family history of epilepsy and other diseases of the nervous system: Secondary | ICD-10-CM

## 2020-04-25 DIAGNOSIS — R55 Syncope and collapse: Secondary | ICD-10-CM

## 2020-04-25 DIAGNOSIS — R350 Frequency of micturition: Secondary | ICD-10-CM

## 2020-04-25 DIAGNOSIS — H539 Unspecified visual disturbance: Secondary | ICD-10-CM

## 2020-04-25 MED ORDER — GADOBENATE DIMEGLUMINE 529 MG/ML IV SOLN
15.0000 mL | Freq: Once | INTRAVENOUS | Status: AC | PRN
  Administered 2020-04-25: 18:00:00 15 mL via INTRAVENOUS

## 2020-04-27 ENCOUNTER — Ambulatory Visit: Payer: Managed Care, Other (non HMO) | Attending: Internal Medicine

## 2020-04-27 DIAGNOSIS — Z23 Encounter for immunization: Secondary | ICD-10-CM

## 2020-04-27 NOTE — Progress Notes (Addendum)
   Covid-19 Vaccination Clinic  Name:  Kari Carrillo    MRN: [id number] DOB: 2001-01-08  04/27/2020  [name] was observed post Covid-19 immunization for 30 minutes based on pre-vaccination screening .  During the observation period, she experienced an adverse reaction with the following symptoms:  swallowing difficulty.  Assessment : Time of assessment 11:50am . Alert and oriented. C/O difficulty swallowing.   Actions taken: 119/57-79-20-99% O2 room air. Oral mucosa and pharynx examined no redness or swelling noted by me or paramedic. No CP, SOB, wheezing, or dizziness.   Vitals sign taken  VAERS form completed Allergy documented Claritin 10mg  po given at 11:52am    Medications administered: Loratadine (Claritin) 10 mg  by mouth. Time: 1152. Administered by Tula Nakayama, RN.  11:54am- Swallowing better.   11:57am- States swallowing difficulty has returned, 25mg  PO Benadryl given, did not want to take liquid due to her allergy history. She has taken PO Claritin and Benadryl in the past.  12:02pm- 112/56-80-20-99% O2 sat. Swallowing difficulty resolved. States she has a mild headache. She her make feels tight. Has had HAs in the past. A&Ox4.  12:09pm- B/P 125/71. Denies any difficulty swallowing, no hives or itching. Father at side. Mom drove them to vaccine clinic. Pt is ambulating without any dizziness. Discharged home with father. HA is improving.  Disposition: Pt received vaccine at 11:26am, discharged at 12:15pm with father. Swallowing difficulty resolved. A&Ox4.    Immunizations Administered    Name Date Dose VIS Date Route   Pfizer COVID-19 Vaccine 04/27/2020 11:26 AM 0.3 mL 02/14/2019 Intramuscular   Manufacturer: [place]   Lot: [id number]   [place]: [id number]

## 2020-04-30 ENCOUNTER — Telehealth: Payer: Self-pay | Admitting: *Deleted

## 2020-04-30 NOTE — Telephone Encounter (Signed)
Called and spoke with patient in regards to her first [place] vaccine that she received on May 8th. She stated that shortly after she just felt some tightness in her throat that caused her to have a little bit of difficulty swallowing. She denies any itching, angioedema, or tingling feelings. She also denies any rash or hives as well. She states shortly after receiving medications at the clinic she felt fine. Please advise any recommendations for her next vaccine. Thank You.

## 2020-05-01 NOTE — Telephone Encounter (Signed)
Called and left a voicemail asking for patient to call back to inform.

## 2020-05-01 NOTE — Telephone Encounter (Signed)
Reviewed her vaccination note from the clinic.  All of her vitals remained stable.  Her symptoms improved with Claritin and Benadryl.  Honestly, I think she can premedicate with 2 Zyrtec about 30 minutes before her next dose.  I do not think that going through the COVID-19 vaccine competitive testing is indicated at this time.  I would also recommend that she wait 30 minutes instead of 15 minutes after her second dose.  Salvatore Marvel, MD [facility] [place] [facility] [place]

## 2020-05-02 ENCOUNTER — Telehealth: Payer: Self-pay | Admitting: Allergy & Immunology

## 2020-05-02 NOTE — Telephone Encounter (Signed)
Patient was returning your phone call [phone].

## 2020-05-02 NOTE — Telephone Encounter (Signed)
Called and left a voicemail for the patient asking for her to return call to inform.

## 2020-05-02 NOTE — Telephone Encounter (Signed)
Called and advised to patient. Patient verbalized understanding.  

## 2020-05-13 ENCOUNTER — Encounter: Payer: Self-pay | Admitting: *Deleted

## 2020-05-14 ENCOUNTER — Ambulatory Visit: Payer: Managed Care, Other (non HMO) | Admitting: Diagnostic Neuroimaging

## 2020-05-14 ENCOUNTER — Other Ambulatory Visit: Payer: Self-pay

## 2020-05-14 ENCOUNTER — Encounter: Payer: Self-pay | Admitting: Diagnostic Neuroimaging

## 2020-05-14 VITALS — BP 114/69 | HR 78 | Ht 67.0 in | Wt 180.2 lb

## 2020-05-14 DIAGNOSIS — R269 Unspecified abnormalities of gait and mobility: Secondary | ICD-10-CM | POA: Diagnosis not present

## 2020-05-14 NOTE — Patient Instructions (Signed)
  GAIT DIFF / VISION CHANGES - check MRI cervical and thoracic spine w/wo - increase fluid intake

## 2020-05-14 NOTE — Progress Notes (Signed)
[facility]  PATIENT: Kari Carrillo DOB: [birth year]  REFERRING CLINICIAN: Janie Morning, DO HISTORY FROM: patient  REASON FOR VISIT: new consult    HISTORICAL  CHIEF COMPLAINT:  Chief Complaint  Patient presents with  . Balance Issues    rm 6 New Pt, parents- [name] "few months of balance issues, vision issues, one episode of near syncope"    HISTORY OF PRESENT ILLNESS:   19 year old female here for evaluation of lightheadedness and dizziness.  02/27/2020 patient was at home.  Her father had a syncopal event and had to go to the hospital.  Later that morning she was at the sink washing dishes when all of a sudden she felt lightheaded and dizzy.  Her mother was there and helped her sit down.  Patient briefly had tunnel vision but did not fully lose consciousness.  EMS called to patient's home and initial blood pressure was 64/48.  She was having nausea and headaches.  Blood pressure improved to 99/62 upon arrival to ER.  Since that time patient has had follow-up with PCP and MRI of the brain.  Few nonspecific T2 hyperintensities were noted.  Patient's father has multiple sclerosis (also patient of mine) and therefore consultation was requested.  Patient has been having some intermittent balance issues urinary frequency issues.  Also having some intermittent visual changes.   REVIEW OF SYSTEMS: Full 14 system review of systems performed and negative with exception of: As per HPI.  ALLERGIES: Allergies  Allergen Reactions  . Other Anaphylaxis and Shortness Of Breath    Raw Fruit  . Other Anaphylaxis and Other (See Comments)    *per pt* all fruits and vegetables that grow on trees- including medication containing these products.     HOME MEDICATIONS: Outpatient Medications Prior to Visit  Medication Sig Dispense Refill  . albuterol (PROVENTIL HFA;VENTOLIN HFA) 108 (90 Base) MCG/ACT inhaler Inhale 1-2 puffs into the lungs every 4 (four) hours as  needed for wheezing or shortness of breath. 1 Inhaler 0  . beclomethasone (QVAR) 40 MCG/ACT inhaler Inhale 1 puff into the lungs 2 (two) times daily.    Marland Kitchen EPINEPHrine 0.3 mg/0.3 mL IJ SOAJ injection Inject 0.3 mg into the muscle once as needed (allergic reaction).     Marland Kitchen loratadine (CLARITIN) 10 MG tablet Take 10 mg by mouth daily as needed for allergies.    . Multiple Vitamins-Minerals (SUPER THERA VITE M) TABS Take by mouth daily.    . predniSONE (DELTASONE) 20 MG tablet 3 tabs po daily x 3 days, then 2 tabs x 3 days, then 1.5 tabs x 3 days, then 1 tab x 3 days, then 0.5 tabs x 3 days 27 tablet 0  . famotidine (PEPCID) 20 MG tablet Take 1 tablet (20 mg total) by mouth 2 (two) times daily. (Patient not taking: Reported on 05/14/2020) 30 tablet 0  . metoCLOPramide (REGLAN) 10 MG tablet Take 1 tablet (10 mg total) by mouth every 6 (six) hours as needed for nausea (nausea/headache). (Patient not taking: Reported on 05/14/2020) 8 tablet 0  . nystatin-triamcinolone ointment (MYCOLOG) Apply thinly to external skin twice daily for 5 days (Patient not taking: Reported on 05/14/2020) 30 g 0   No facility-administered medications prior to visit.    PAST MEDICAL HISTORY: Past Medical History:  Diagnosis Date  . Allergy-induced asthma   . Asthma   . Balance problem   . GERD (gastroesophageal reflux disease)   . Hyperkalemia   . Near syncope   . Urinary  frequency   . Vision disturbance     PAST SURGICAL HISTORY: No past surgical history on file.  FAMILY HISTORY: Family History  Problem Relation Age of Onset  . Asthma Mother   . Diabetes Father   . Multiple sclerosis Father   . Diabetes Paternal Grandmother     SOCIAL HISTORY: Social History   Socioeconomic History  . Marital status: Single    Spouse name: Not on file  . Number of children: Not on file  . Years of education: Not on file  . Highest education level: Some college, no degree  Occupational History  . Not on file  Tobacco  Use  . Smoking status: Never Smoker  . Smokeless tobacco: Never Used  Substance and Sexual Activity  . Alcohol use: No  . Drug use: No  . Sexual activity: Never    Birth control/protection: None  Other Topics Concern  . Not on file  Social History Narrative          Social Determinants of Health   Financial Resource Strain:   . Difficulty of Paying Living Expenses:   Food Insecurity:   . Worried About Charity fundraiser in the Last Year:   . Arboriculturist in the Last Year:   Transportation Needs:   . Film/video editor (Medical):   Marland Kitchen Lack of Transportation (Non-Medical):   Physical Activity:   . Days of Exercise per Week:   . Minutes of Exercise per Session:   Stress:   . Feeling of Stress :   Social Connections:   . Frequency of Communication with Friends and Family:   . Frequency of Social Gatherings with Friends and Family:   . Attends Religious Services:   . Active Member of Clubs or Organizations:   . Attends Archivist Meetings:   Marland Kitchen Marital Status:   Intimate Partner Violence:   . Fear of Current or Ex-Partner:   . Emotionally Abused:   Marland Kitchen Physically Abused:   . Sexually Abused:      PHYSICAL EXAM  GENERAL EXAM/CONSTITUTIONAL: Vitals:  Vitals:   05/14/20 1024  BP: 114/69  Pulse: 78  Weight: 180 lb 3.2 oz (81.7 kg)  Height: 5\' 7"  (1.702 m)     Body mass index is 28.22 kg/m. Wt Readings from Last 3 Encounters:  05/14/20 180 lb 3.2 oz (81.7 kg)  02/27/20 179 lb (81.2 kg)  07/29/18 185 lb 9.6 oz (84.2 kg) (96 %, Z= 1.72)*   * Growth percentiles are based on CDC (Girls, 2-20 Years) data.     Patient is in no distress; well developed, nourished and groomed; neck is supple  CARDIOVASCULAR:  Examination of carotid arteries is normal; no carotid bruits  Regular rate and rhythm, no murmurs  Examination of peripheral vascular system by observation and palpation is normal  EYES:  Ophthalmoscopic exam of optic discs and  posterior segments is normal; no papilledema or hemorrhages  No exam data present  MUSCULOSKELETAL:  Gait, strength, tone, movements noted in Neurologic exam below  NEUROLOGIC: MENTAL STATUS:  No flowsheet data found.  awake, alert, oriented to person, place and time  recent and remote memory intact  normal attention and concentration  language fluent, comprehension intact, naming intact  fund of knowledge appropriate  CRANIAL NERVE:   2nd - no papilledema on fundoscopic exam  2nd, 3rd, 4th, 6th - pupils equal and reactive to light, visual fields full to confrontation, extraocular muscles intact, no nystagmus  5th -  facial sensation symmetric  7th - facial strength symmetric  8th - hearing intact  9th - palate elevates symmetrically, uvula midline  11th - shoulder shrug symmetric  12th - tongue protrusion midline  MOTOR:   normal bulk and tone, full strength in the BUE, BLE  SENSORY:   normal and symmetric to light touch, temperature, vibration  COORDINATION:   finger-nose-finger, fine finger movements normal  REFLEXES:   deep tendon reflexes present and symmetric  GAIT/STATION:   narrow based gait; romberg is negative    DIAGNOSTIC DATA (LABS, IMAGING, TESTING) - I reviewed patient records, labs, notes, testing and imaging myself where available.  Lab Results  Component Value Date   WBC 9.9 02/27/2020   HGB 12.6 02/27/2020   HCT 41.2 02/27/2020   MCV 93.8 02/27/2020   PLT 198 02/27/2020      Component Value Date/Time   NA 138 02/27/2020 1330   K 6.0 (H) 02/27/2020 1330   CL 107 02/27/2020 1330   CO2 20 (L) 02/27/2020 1330   GLUCOSE 81 02/27/2020 1330   BUN 8 02/27/2020 1330   CREATININE 0.77 02/27/2020 1330   CALCIUM 9.2 02/27/2020 1330   PROT 7.6 06/07/2018 1045   ALBUMIN 4.3 06/07/2018 1045   AST 24 06/07/2018 1045   ALT 35 06/07/2018 1045   ALKPHOS 71 06/07/2018 1045   BILITOT 1.0 06/07/2018 1045   GFRNONAA >60 02/27/2020  1330   GFRAA >60 02/27/2020 1330   No results found for: CHOL, HDL, LDLCALC, LDLDIRECT, TRIG, CHOLHDL No results found for: HGBA1C No results found for: VITAMINB12 No results found for: TSH   04/25/20 MRI brain [I reviewed images myself and agree with interpretation. -VRP]  1. Few scattered foci of T2/FLAIR hyperintensity involving the periventricular and subcortical white matter both cerebral hemispheres, nonspecific. Differential considerations are broad, and include sequelae of accelerated/hereditary chronic small-vessel ischemia, changes related to prior trauma, hypercoagulable state, vasculitis, sequelae of complicated migraines, prior infectious or inflammatory process, or possibly demyelination. No associated enhancement. 2. Otherwise unremarkable and normal brain MRI.   ASSESSMENT AND PLAN  19 y.o. year old female here with:   Dx:  1. Gait difficulty     PLAN:  PRE-SYNCOPE / GAIT DIFF / VISION CHANGES - check MRI cervical and thoracic spine w/wo (rule out demyelinating dz) - increase fluid intake - follow up with allergy / immunology re: food allergies - follow up with PCP re: pre-syncope  Orders Placed This Encounter  Procedures  . MR CERVICAL SPINE W WO CONTRAST  . MR THORACIC SPINE W WO CONTRAST   Return in about 3 months (around 08/14/2020).    Penni Bombard, MD [id number], [id number] AM Certified in Neurology, Neurophysiology and Neuroimaging  [facility] [address] [place], [place] [address] [phone]

## 2020-05-15 ENCOUNTER — Ambulatory Visit: Payer: Managed Care, Other (non HMO) | Admitting: Physician Assistant

## 2020-05-15 ENCOUNTER — Telehealth: Payer: Self-pay | Admitting: Diagnostic Neuroimaging

## 2020-05-15 NOTE — Telephone Encounter (Signed)
[employer] order sent to GI. They will obtain the auth and reach out to the patient to schedule.  

## 2020-05-21 ENCOUNTER — Ambulatory Visit: Payer: Managed Care, Other (non HMO) | Attending: Internal Medicine

## 2020-05-21 DIAGNOSIS — Z23 Encounter for immunization: Secondary | ICD-10-CM

## 2020-05-21 NOTE — Progress Notes (Signed)
   Covid-19 Vaccination Clinic  Name:  Kari Carrillo    MRN: [id number] DOB: April 07, 2001  05/21/2020  [name] was observed post Covid-19 immunization for 30 minutes based on pre-vaccination screening without incident. She was provided with Vaccine Information Sheet and instruction to access the V-Safe system.   [name] was instructed to call 911 with any severe reactions post vaccine: Marland Kitchen Difficulty breathing  . Swelling of face and throat  . A fast heartbeat  . A bad rash all over body  . Dizziness and weakness   Immunizations Administered    Name Date Dose VIS Date Route   Pfizer COVID-19 Vaccine 05/21/2020  8:37 AM 0.3 mL 02/14/2019 Intramuscular   Manufacturer: [employer], [employer]   Lot: [id number]   [place]: [id number]

## 2020-05-21 NOTE — Progress Notes (Signed)
   Covid-19 Vaccination Clinic  Name:  Almeta Pita    MRN: [id number] DOB: [birth year]  05/21/2020  Ms. Bottger was observed post Covid-19 immunization for 15 minutes without incident. She was provided with Vaccine Information Sheet and instruction to access the V-Safe system.   Ms. Laslie was instructed to call 911 with any severe reactions post vaccine: Marland Kitchen Difficulty breathing  . Swelling of face and throat  . A fast heartbeat  . A bad rash all over body  . Dizziness and weakness   Immunizations Administered    Name Date Dose VIS Date Route   Pfizer COVID-19 Vaccine 05/21/2020  8:37 AM 0.3 mL 02/14/2019 Intramuscular   Manufacturer: [employer], [employer]   Lot: [id number]   [place]: [id number]

## 2020-05-24 ENCOUNTER — Ambulatory Visit
Admission: RE | Admit: 2020-05-24 | Discharge: 2020-05-24 | Disposition: A | Discharge status: Home / Self Care | Payer: Managed Care, Other (non HMO) | Source: Ambulatory Visit | Attending: Diagnostic Neuroimaging | Admitting: Diagnostic Neuroimaging

## 2020-05-24 ENCOUNTER — Other Ambulatory Visit: Payer: Managed Care, Other (non HMO)

## 2020-05-24 DIAGNOSIS — R269 Unspecified abnormalities of gait and mobility: Secondary | ICD-10-CM | POA: Diagnosis not present

## 2020-05-24 MED ORDER — GADOBENATE DIMEGLUMINE 529 MG/ML IV SOLN
16.0000 mL | Freq: Once | INTRAVENOUS | Status: AC | PRN
  Administered 2020-05-24: 16 mL via INTRAVENOUS

## 2020-05-29 ENCOUNTER — Encounter: Payer: Self-pay | Admitting: *Deleted

## 2020-05-29 ENCOUNTER — Telehealth: Payer: Self-pay | Admitting: *Deleted

## 2020-05-29 NOTE — Telephone Encounter (Signed)
Spoke with patient and informed her the MRI cervical and thoracic spine are normal. She verbalized understanding, appreciation.

## 2020-06-22 ENCOUNTER — Emergency Department (HOSPITAL_COMMUNITY)
Admission: EM | Admit: 2020-06-22 | Discharge: 2020-06-22 | Disposition: A | Discharge status: Home / Self Care | Payer: Managed Care, Other (non HMO) | Attending: Emergency Medicine | Admitting: Emergency Medicine

## 2020-06-22 ENCOUNTER — Encounter (HOSPITAL_COMMUNITY): Payer: Self-pay | Admitting: Emergency Medicine

## 2020-06-22 ENCOUNTER — Other Ambulatory Visit: Payer: Self-pay

## 2020-06-22 DIAGNOSIS — J45909 Unspecified asthma, uncomplicated: Secondary | ICD-10-CM | POA: Diagnosis not present

## 2020-06-22 DIAGNOSIS — Z79899 Other long term (current) drug therapy: Secondary | ICD-10-CM | POA: Diagnosis not present

## 2020-06-22 DIAGNOSIS — H9203 Otalgia, bilateral: Secondary | ICD-10-CM

## 2020-06-22 NOTE — Discharge Instructions (Addendum)
You can try taking antihistamines to help your symptoms. Please attend your appointment with the ENT specialists for further management.

## 2020-06-22 NOTE — ED Triage Notes (Signed)
Patient here from home reporting left ear fullness, states that "sounds are muffled". Symptoms started 6/28.

## 2020-06-22 NOTE — ED Provider Notes (Signed)
[place] DEPT Provider Note   CSN: [id number] Arrival date & time: 06/22/20  1613     History Chief Complaint  Patient presents with   Ear Fullness    Kari Carrillo is a 19 y.o. female presenting with left ear fullness over 1 week.  Reports some muffled hearing as well with phonophobia. She has some associated headache, though only 3/10 severity at this time.  She is treating symptoms with leftover prescription of Ciprodex.  Symptoms are not improving.  She is taking Tylenol ibuprofen are improving with associated headache.  Saw her PCP on Tuesday and they flushed her ear.  She has an ENT appointment in 2 days. No other URI sx.  The history is provided by the patient.       Past Medical History:  Diagnosis Date   Allergy-induced asthma    Asthma    Balance problem    GERD (gastroesophageal reflux disease)    Hyperkalemia    Near syncope    Urinary frequency    Vision disturbance     Patient Active Problem List   Diagnosis Date Noted   Gastroesophageal reflux disease without esophagitis 08/05/2015   Irritable bowel syndrome with diarrhea 08/05/2015    History reviewed. No pertinent surgical history.   OB History    Gravida  0   Para  0   Term  0   Preterm  0   AB  0   Living  0     SAB  0   TAB  0   Ectopic  0   Multiple  0   Live Births  0           Family History  Problem Relation Age of Onset   Asthma Mother    Diabetes Father    Multiple sclerosis Father    Diabetes Paternal Grandmother     Social History   Tobacco Use   Smoking status: Never Smoker   Smokeless tobacco: Never Used  Vaping Use   Vaping Use: Never used  Substance Use Topics   Alcohol use: No   Drug use: No    Home Medications Prior to Admission medications   Medication Sig Start Date End Date Taking? Authorizing Provider  albuterol (PROVENTIL HFA;VENTOLIN HFA) 108 (90 Base) MCG/ACT inhaler Inhale 1-2  puffs into the lungs every 4 (four) hours as needed for wheezing or shortness of breath. 01/21/16   Harris, Vernie Shanks, PA-C  beclomethasone (QVAR) 40 MCG/ACT inhaler Inhale 1 puff into the lungs 2 (two) times daily.    [provider]  EPINEPHrine 0.3 mg/0.3 mL IJ SOAJ injection Inject 0.3 mg into the muscle once as needed (allergic reaction).     [provider]  famotidine (PEPCID) 20 MG tablet Take 1 tablet (20 mg total) by mouth 2 (two) times daily. Patient not taking: Reported on 05/14/2020 09/09/16   Nona Dell, PA-C  loratadine (CLARITIN) 10 MG tablet Take 10 mg by mouth daily as needed for allergies.    [provider]  metoCLOPramide (REGLAN) 10 MG tablet Take 1 tablet (10 mg total) by mouth every 6 (six) hours as needed for nausea (nausea/headache). Patient not taking: Reported on 05/14/2020 07/19/18   Drenda Freeze, MD  Multiple Vitamins-Minerals (SUPER THERA VITE M) TABS Take by mouth daily.    [provider]  nystatin-triamcinolone ointment Lilyan Gilford) Apply thinly to external skin twice daily for 5 days Patient not taking: Reported on 05/14/2020 06/22/18   Hollice Espy,  Phylis Bougie, CNM  predniSONE (DELTASONE) 20 MG tablet 3 tabs po daily x 3 days, then 2 tabs x 3 days, then 1.5 tabs x 3 days, then 1 tab x 3 days, then 0.5 tabs x 3 days 01/21/16   Margarita Mail, PA-C    Allergies    Other and Other  Review of Systems   Review of Systems  All other systems reviewed and are negative.   Physical Exam Updated Vital Signs BP 100/65 (BP Location: Right Arm)    Pulse 77    Temp 98 F (36.7 C) (Oral)    Resp 19    SpO2 100%   Physical Exam Vitals and nursing note reviewed.  Constitutional:      General: She is not in acute distress.    Appearance: She is well-developed. She is not ill-appearing.  HENT:     Head: Normocephalic and atraumatic.     Right Ear: Tympanic membrane, ear canal and external ear normal.     Left Ear: Tympanic  membrane, ear canal and external ear normal.     Mouth/Throat:     Mouth: Mucous membranes are moist.     Pharynx: Oropharynx is clear.  Eyes:     Conjunctiva/sclera: Conjunctivae normal.  Cardiovascular:     Rate and Rhythm: Normal rate and regular rhythm.  Pulmonary:     Effort: Pulmonary effort is normal.     Breath sounds: Normal breath sounds.  Musculoskeletal:     Cervical back: Normal range of motion and neck supple. No tenderness.  Lymphadenopathy:     Cervical: No cervical adenopathy.  Neurological:     Mental Status: She is alert.  Psychiatric:        Mood and Affect: Mood normal.        Behavior: Behavior normal.     ED Results / Procedures / Treatments   Labs (all labs ordered are listed, but only abnormal results are displayed) Labs Reviewed - No data to display  EKG None  Radiology No results found.  Procedures Procedures (including critical care time)  Medications Ordered in ED Medications - No data to display  ED Course  I have reviewed the triage vital signs and the nursing notes.  Pertinent labs & imaging results that were available during my care of the patient were reviewed by me and considered in my medical decision making (see chart for details).    MDM Rules/Calculators/A&P                          Patient presenting with muffled hearing and ear fullness. She has an ENT appointment in 2 days, however presents to the ED for evaluation today. ENT exam is unremarkable. Pt is well-appearing and in no distress. Per chart review, pt had MRI brain and spine in May of this year; negative. She was being worked up [employer] by neurology for syncopal episodes. No evidence of acute or life-threatening pathology at this time. Suspect possible eustachian tube dysfunction. Recommend antihistamines. Encourage she attend her ENT appointment for further evaluation. Appropriate for discharge.  Discussed results, findings, treatment and follow up. Patient advised of  return precautions. Patient verbalized understanding and agreed with plan.  Final Clinical Impression(s) / ED Diagnoses Final diagnoses:  Otalgia of both ears    Rx / DC Orders ED Discharge Orders    None       Marshay Slates, [place] N, PA-C 06/22/20 Landess, Ankit, MD 06/23/20  1431 ° °

## 2020-08-01 ENCOUNTER — Telehealth: Payer: Self-pay

## 2020-08-01 NOTE — Telephone Encounter (Signed)
AEX 07/29/2018 with SM x 1, saw DL previously for OV. Not SA, abstinence  Spoke with pt. Pt states finding a breast lump on right side last night with SBE. Pt states lump is movable, but tender. Pt denies any breast skin, size changes, fever, chills, site redness or warmth. Pt states had Covid vaccine series. 2nd vaccine given 05/21/20. Pt states had vaccines in both arms.  Pt advised to be seen for further evaluation. Pt agreeable. Pt declined earlier appts offered.  Pt scheduled with Dr Talbert Nan on 8/17 at 3 pm per pt's request due to personal schedule. Pt agreeable and verbalized understanding of date and time of appt.   Encounter closed.

## 2020-08-01 NOTE — Telephone Encounter (Signed)
Patients mother is calling in regards to patient finding a lump in breast.

## 2020-08-05 ENCOUNTER — Ambulatory Visit: Payer: Managed Care, Other (non HMO) | Admitting: Diagnostic Neuroimaging

## 2020-08-05 ENCOUNTER — Telehealth: Payer: Self-pay | Admitting: *Deleted

## 2020-08-05 NOTE — Telephone Encounter (Signed)
Called patient and informed her provider is out of office today, flight canceled. We rescheduled. Patient verbalized understanding, appreciation.

## 2020-08-06 ENCOUNTER — Other Ambulatory Visit: Payer: Self-pay

## 2020-08-06 ENCOUNTER — Encounter: Payer: Self-pay | Admitting: Obstetrics and Gynecology

## 2020-08-06 ENCOUNTER — Ambulatory Visit: Payer: Managed Care, Other (non HMO) | Admitting: Obstetrics and Gynecology

## 2020-08-06 VITALS — BP 136/72 | HR 60 | Temp 97.5°F | Wt 179.6 lb

## 2020-08-06 DIAGNOSIS — N926 Irregular menstruation, unspecified: Secondary | ICD-10-CM

## 2020-08-06 DIAGNOSIS — N946 Dysmenorrhea, unspecified: Secondary | ICD-10-CM

## 2020-08-06 DIAGNOSIS — N644 Mastodynia: Secondary | ICD-10-CM

## 2020-08-06 MED ORDER — IBUPROFEN 800 MG PO TABS: 800.0000 mg | ORAL_TABLET | Freq: Three times a day (TID) | ORAL | 1 refills | Status: DC | PRN

## 2020-08-06 NOTE — Progress Notes (Signed)
GYNECOLOGY  VISIT   HPI: 19 y.o.   Single Black or African American Not Hispanic or Latino  female   G0P0000 with Patient's last menstrual period was 07/23/2020 (approximate).   here for  Right breast lump. Patient noticed this Thursday. It is tender to palpation, no skin changes, no fevers.  Prior to the last couple of months her cycles were monthly x 7 days. Saturating a pad in up to 4-5 hours. Bad cramps. Takes advil, if she takes 800 mg it helps.  In the last few months her cycles her cycles have ranged from 4-5.5 weeks. Bleeding from 2-7 days, some cycles have been very light, other days have been heavy where she saturates the pad in 4-5 hours. She has had cramps all of these cycles.  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 Neuroscience. Wants to be a Publishing rights manager. No galactorrhea. She has had dry skin. She is intentionally loosing weight. No constipation, fatigue or hair loss.   GYNECOLOGIC HISTORY: Patient's last menstrual period was 07/23/2020 (approximate). Contraception:Absintence Menopausal hormone therapy: None        OB History    Gravida  0   Para  0   Term  0   Preterm  0   AB  0   Living  0     SAB  0   TAB  0   Ectopic  0   Multiple  0   Live Births  0              Patient Active Problem List   Diagnosis Date Noted   Gastroesophageal reflux disease without esophagitis 08/05/2015   Irritable bowel syndrome with diarrhea 08/05/2015    Past Medical History:  Diagnosis Date   Allergy-induced asthma    Asthma    Balance problem    GERD (gastroesophageal reflux disease)    Hyperkalemia    Near syncope    Urinary frequency    Vision disturbance     History reviewed. No pertinent surgical history.  Current Outpatient Medications  Medication Sig Dispense Refill   albuterol (PROVENTIL HFA;VENTOLIN HFA) 108 (90 Base) MCG/ACT inhaler Inhale 1-2 puffs into the lungs every 4 (four) hours as  needed for wheezing or shortness of breath. 1 Inhaler 0   beclomethasone (QVAR) 40 MCG/ACT inhaler Inhale 1 puff into the lungs 2 (two) times daily.     EPINEPHrine 0.3 mg/0.3 mL IJ SOAJ injection Inject 0.3 mg into the muscle once as needed (allergic reaction).      loratadine (CLARITIN) 10 MG tablet Take 10 mg by mouth daily as needed for allergies.     Multiple Vitamins-Minerals (SUPER THERA VITE M) TABS Take by mouth daily.     famotidine (PEPCID) 20 MG tablet Take 1 tablet (20 mg total) by mouth 2 (two) times daily. (Patient not taking: Reported on 05/14/2020) 30 tablet 0   metoCLOPramide (REGLAN) 10 MG tablet Take 1 tablet (10 mg total) by mouth every 6 (six) hours as needed for nausea (nausea/headache). (Patient not taking: Reported on 05/14/2020) 8 tablet 0   nystatin-triamcinolone ointment (MYCOLOG) Apply thinly to external skin twice daily for 5 days (Patient not taking: Reported on 05/14/2020) 30 g 0   predniSONE (DELTASONE) 20 MG tablet 3 tabs po daily x 3 days, then 2 tabs x 3 days, then 1.5 tabs x 3 days, then 1 tab x 3 days, then 0.5 tabs x 3 days (Patient not taking: Reported on 08/06/2020)  27 tablet 0   No current facility-administered medications for this visit.     ALLERGIES: Other and Other  Family History  Problem Relation Age of Onset   Asthma Mother    Diabetes Father    Multiple sclerosis Father    Diabetes Paternal Grandmother     Social History   Socioeconomic History   Marital status: Single    Spouse name: Not on file   Number of children: Not on file   Years of education: Not on file   Highest education level: Some college, no degree  Occupational History   Not on file  Tobacco Use   Smoking status: Never Smoker   Smokeless tobacco: Never Used  Vaping Use   Vaping Use: Never used  Substance and Sexual Activity   Alcohol use: No   Drug use: No   Sexual activity: Never    Birth control/protection: None  Other Topics Concern    Not on file  Social History Narrative          Social Determinants of Health   Financial Resource Strain:    Difficulty of Paying Living Expenses:   Food Insecurity:    Worried About Charity fundraiser in the Last Year:    Arboriculturist in the Last Year:   Transportation Needs:    Film/video editor (Medical):    Lack of Transportation (Non-Medical):   Physical Activity:    Days of Exercise per Week:    Minutes of Exercise per Session:   Stress:    Feeling of Stress :   Social Connections:    Frequency of Communication with Friends and Family:    Frequency of Social Gatherings with Friends and Family:    Attends Religious Services:    Active Member of Clubs or Organizations:    Attends Music therapist:    Marital Status:   Intimate Partner Violence:    Fear of Current or Ex-Partner:    Emotionally Abused:    Physically Abused:    Sexually Abused:     Review of Systems  Constitutional: Negative.   HENT: Negative.   Eyes: Negative.   Respiratory: Negative.   Cardiovascular: Negative.   Gastrointestinal: Negative.   Genitourinary: Negative.   Musculoskeletal: Negative.   Skin:       Right breast lump  Neurological: Negative.   Endo/Heme/Allergies: Negative.   Psychiatric/Behavioral: Negative.     PHYSICAL EXAMINATION:    BP 136/72 (BP Location: Right Arm, Patient Position: Sitting, Cuff Size: Normal)    Pulse 60    Temp (!) 97.5 F (36.4 C) (Skin)    Wt 179 lb 9.6 oz (81.5 kg)    LMP 07/23/2020 (Approximate)    BMI 28.13 kg/m     General appearance: alert, cooperative and appears stated age Breasts: there is a ridge of tissue in bilateral upper outer breasts, tender on both sides. No distinct lump noted. Patient examined sitting and supine.  No axillary adenopathy.   ASSESSMENT Breast tenderness, patient feels a lump, feels symmetric to me.  Irregular cycles. Never sexually active. She is under stress with school Severe  dysmenorrhea    PLAN Information on breast tenderness given Recommend that she not check her breast exam again until after her period. Call during her period to set up a visit with me just after her cycle. TSH, prolactin now Ibuprofen script sent.    An After Visit Summary was printed and given to the patient.

## 2020-08-06 NOTE — Patient Instructions (Signed)

## 2020-08-07 LAB — TSH: TSH: 0.578 u[IU]/mL (ref 0.450–4.500)

## 2020-08-07 LAB — PROLACTIN: Prolactin: 29.4 ng/mL — ABNORMAL HIGH (ref 4.8–23.3)

## 2020-08-12 ENCOUNTER — Other Ambulatory Visit: Payer: Self-pay

## 2020-08-12 DIAGNOSIS — R7989 Other specified abnormal findings of blood chemistry: Secondary | ICD-10-CM

## 2020-08-14 ENCOUNTER — Telehealth: Payer: Self-pay

## 2020-08-14 NOTE — Telephone Encounter (Signed)
Patients mother is calling to go over patients results. Patients mother stated "the patient couldn't explain it to her well".

## 2020-08-14 NOTE — Telephone Encounter (Addendum)
Spoke with patients mother, Seth Bake, ok per dpr.  Reviewed 08/06/20 lab results and recommendations. Questions answered.  Advised will wait for repeat results for next steps in care.  We did discuss possibility of brain MRI if prolactin level is still elevated.  Mother states patient did have a MRI of the brain 04/25/20, results in [facility]. Advised I will provide update to Dr. Talbert Nan and we will notify patient of final results once completed and reviewed. Mother thankful for call.   Routing to provider for final review. Patient is agreeable to disposition. Will close encounter.

## 2020-08-15 ENCOUNTER — Telehealth: Payer: Self-pay | Admitting: Obstetrics and Gynecology

## 2020-08-15 NOTE — Telephone Encounter (Signed)
Spoke with pt's mother Revonda Standard per DPR. Mother states pt has started cycle today 08/15/20 and wanted to know if can keep or needs to change prolactin level for 08/16/20?   Advised mother would review with Dr Talbert Nan and return call. Agreeable.

## 2020-08-15 NOTE — Telephone Encounter (Signed)
Patient's mom Seth Bake (ok per dpr) calling because patient started period and mom is wanting to know if she should keep lab appointment for prolactin levels

## 2020-08-15 NOTE — Telephone Encounter (Signed)
It's fine for her to come get her prolactin tomorrow.

## 2020-08-16 ENCOUNTER — Other Ambulatory Visit (INDEPENDENT_AMBULATORY_CARE_PROVIDER_SITE_OTHER): Payer: Managed Care, Other (non HMO)

## 2020-08-16 ENCOUNTER — Other Ambulatory Visit: Payer: Self-pay

## 2020-08-16 DIAGNOSIS — R7989 Other specified abnormal findings of blood chemistry: Secondary | ICD-10-CM

## 2020-08-16 NOTE — Telephone Encounter (Signed)
Spoke with pt's mother. Mother given update from Dr Talbert Nan. Mother will let pt know to keep lab appt.  Encounter closed

## 2020-08-17 LAB — BASIC METABOLIC PANEL
BUN/Creatinine Ratio: 14 (ref 9–23)
BUN: 8 mg/dL (ref 6–20)
CO2: 22 mmol/L (ref 20–29)
Calcium: 9.5 mg/dL (ref 8.7–10.2)
Chloride: 106 mmol/L (ref 96–106)
Creatinine, Ser: 0.58 mg/dL (ref 0.57–1.00)
GFR calc Af Amer: 152 mL/min/{1.73_m2} (ref >59)
GFR calc non Af Amer: 132 mL/min/{1.73_m2} (ref >59)
Glucose: 83 mg/dL (ref 65–99)
Potassium: 4.1 mmol/L (ref 3.5–5.2)
Sodium: 140 mmol/L (ref 134–144)

## 2020-08-17 LAB — PROLACTIN: Prolactin: 29.9 ng/mL — ABNORMAL HIGH (ref 4.8–23.3)

## 2020-08-17 NOTE — Telephone Encounter (Signed)
I saw the result prior to realizing she had a normal MRI in 04/25/20 (pituitary was normal).  Given her normal MRI and normal TSH, I suspect her cycle changes are from stress. If she goes 8 weeks without a cycle she should call.

## 2020-08-19 NOTE — Telephone Encounter (Signed)
Spoke with patients mother, Seth Bake, ok per dpr.  Advised as seen below per Dr. Talbert Nan. Mother verbalizes understanding and is agreeable.   Encounter closed.

## 2020-08-21 ENCOUNTER — Other Ambulatory Visit: Payer: Self-pay

## 2020-08-21 ENCOUNTER — Ambulatory Visit (INDEPENDENT_AMBULATORY_CARE_PROVIDER_SITE_OTHER): Payer: Managed Care, Other (non HMO) | Admitting: Diagnostic Neuroimaging

## 2020-08-21 ENCOUNTER — Encounter: Payer: Self-pay | Admitting: Diagnostic Neuroimaging

## 2020-08-21 VITALS — BP 115/75 | HR 79 | Ht 67.0 in | Wt 180.0 lb

## 2020-08-21 DIAGNOSIS — R42 Dizziness and giddiness: Secondary | ICD-10-CM | POA: Diagnosis not present

## 2020-08-21 DIAGNOSIS — R55 Syncope and collapse: Secondary | ICD-10-CM

## 2020-08-21 NOTE — Progress Notes (Signed)
[facility]  PATIENT: Kari Carrillo DOB: 11-30-2001  REFERRING CLINICIAN: Janie Morning, DO HISTORY FROM: patient  REASON FOR VISIT: follow up   HISTORICAL  CHIEF COMPLAINT:  Chief Complaint  Patient presents with  . Gait Problem    rm 7, 3 month FU "evaluated by GYN for delayed menses; review MRI"    HISTORY OF PRESENT ILLNESS:   UPDATE (08/21/20, [facility]): Since last visit, doing well, except for occ pre-syncope events (2 per month). Trying to eat and drink more, but often skips breakfast and has restrictions due to food allergies, sensitivities and preference. Had  Some irregular menses, elevated prolactin level, and wanted to review MRI brain from prior (pituitary review). More stress at school.   PRIOR HPI: 19 year old female here for evaluation of lightheadedness and dizziness.  02/27/2020 patient was at home.  Her father had a syncopal event and had to go to the hospital.  Later that morning she was at the sink washing dishes when all of a sudden she felt lightheaded and dizzy.  Her mother was there and helped her sit down.  Patient briefly had tunnel vision but did not fully lose consciousness.  EMS called to patient's home and initial blood pressure was 64/48.  She was having nausea and headaches.  Blood pressure improved to 99/62 upon arrival to ER.  Since that time patient has had follow-up with PCP and MRI of the brain.  Few nonspecific T2 hyperintensities were noted.  Patient's father has multiple sclerosis (also patient of mine) and therefore consultation was requested.  Patient has been having some intermittent balance issues urinary frequency issues.  Also having some intermittent visual changes.   REVIEW OF SYSTEMS: Full 14 system review of systems performed and negative with exception of: as per HPI.   ALLERGIES: Allergies  Allergen Reactions  . Other Anaphylaxis and Shortness Of Breath    Raw Fruit  . Other Anaphylaxis and Other (See Comments)     *per pt* all fruits and vegetables that grow on trees- including medication containing these products.     HOME MEDICATIONS: Outpatient Medications Prior to Visit  Medication Sig Dispense Refill  . albuterol (PROVENTIL HFA;VENTOLIN HFA) 108 (90 Base) MCG/ACT inhaler Inhale 1-2 puffs into the lungs every 4 (four) hours as needed for wheezing or shortness of breath. 1 Inhaler 0  . beclomethasone (QVAR) 40 MCG/ACT inhaler Inhale 1 puff into the lungs 2 (two) times daily.    Marland Kitchen EPINEPHrine 0.3 mg/0.3 mL IJ SOAJ injection Inject 0.3 mg into the muscle once as needed (allergic reaction).     Marland Kitchen ibuprofen (ADVIL) 800 MG tablet Take 1 tablet (800 mg total) by mouth every 8 (eight) hours as needed. 30 tablet 1  . loratadine (CLARITIN) 10 MG tablet Take 10 mg by mouth daily as needed for allergies.    . Multiple Vitamins-Minerals (SUPER THERA VITE M) TABS Take by mouth daily.    Marland Kitchen nystatin-triamcinolone ointment (MYCOLOG) Apply thinly to external skin twice daily for 5 days 30 g 0  . predniSONE (DELTASONE) 20 MG tablet 3 tabs po daily x 3 days, then 2 tabs x 3 days, then 1.5 tabs x 3 days, then 1 tab x 3 days, then 0.5 tabs x 3 days 27 tablet 0  . famotidine (PEPCID) 20 MG tablet Take 1 tablet (20 mg total) by mouth 2 (two) times daily. (Patient not taking: Reported on 05/14/2020) 30 tablet 0  . metoCLOPramide (REGLAN) 10 MG tablet Take 1 tablet (10 mg  total) by mouth every 6 (six) hours as needed for nausea (nausea/headache). (Patient not taking: Reported on 05/14/2020) 8 tablet 0   No facility-administered medications prior to visit.    PAST MEDICAL HISTORY: Past Medical History:  Diagnosis Date  . Allergy-induced asthma   . Asthma   . Balance problem   . GERD (gastroesophageal reflux disease)   . Hyperkalemia   . Near syncope   . Urinary frequency   . Vision disturbance     PAST SURGICAL HISTORY: No past surgical history on file.  FAMILY HISTORY: Family History  Problem Relation Age of  Onset  . Asthma Mother   . Diabetes Father   . Multiple sclerosis Father   . Diabetes Paternal Grandmother     SOCIAL HISTORY: Social History   Socioeconomic History  . Marital status: Single    Spouse name: Not on file  . Number of children: Not on file  . Years of education: Not on file  . Highest education level: Some college, no degree  Occupational History  . Not on file  Tobacco Use  . Smoking status: Never Smoker  . Smokeless tobacco: Never Used  Vaping Use  . Vaping Use: Never used  Substance and Sexual Activity  . Alcohol use: No  . Drug use: No  . Sexual activity: Never    Birth control/protection: None  Other Topics Concern  . Not on file  Social History Narrative          Social Determinants of Health   Financial Resource Strain:   . Difficulty of Paying Living Expenses: Not on file  Food Insecurity:   . Worried About Charity fundraiser in the Last Year: Not on file  . Ran Out of Food in the Last Year: Not on file  Transportation Needs:   . Lack of Transportation (Medical): Not on file  . Lack of Transportation (Non-Medical): Not on file  Physical Activity:   . Days of Exercise per Week: Not on file  . Minutes of Exercise per Session: Not on file  Stress:   . Feeling of Stress : Not on file  Social Connections:   . Frequency of Communication with Friends and Family: Not on file  . Frequency of Social Gatherings with Friends and Family: Not on file  . Attends Religious Services: Not on file  . Active Member of Clubs or Organizations: Not on file  . Attends Archivist Meetings: Not on file  . Marital Status: Not on file  Intimate Partner Violence:   . Fear of Current or Ex-Partner: Not on file  . Emotionally Abused: Not on file  . Physically Abused: Not on file  . Sexually Abused: Not on file     PHYSICAL EXAM  GENERAL EXAM/CONSTITUTIONAL: Vitals:  Vitals:   08/21/20 1504  BP: 115/75  Pulse: 79  Weight: 180 lb (81.6 kg)   Height: 5\' 7"  (1.702 m)   Body mass index is 28.19 kg/m. Wt Readings from Last 3 Encounters:  08/21/20 180 lb (81.6 kg)  08/06/20 179 lb 9.6 oz (81.5 kg)  05/14/20 180 lb 3.2 oz (81.7 kg)    Patient is in no distress; well developed, nourished and groomed; neck is supple  CARDIOVASCULAR:  Examination of carotid arteries is normal; no carotid bruits  Regular rate and rhythm, no murmurs  Examination of peripheral vascular system by observation and palpation is normal  EYES:  Ophthalmoscopic exam of optic discs and posterior segments is normal; no papilledema  or hemorrhages No exam data present  MUSCULOSKELETAL:  Gait, strength, tone, movements noted in Neurologic exam below  NEUROLOGIC: MENTAL STATUS:  No flowsheet data found.  awake, alert, oriented to person, place and time  recent and remote memory intact  normal attention and concentration  language fluent, comprehension intact, naming intact  fund of knowledge appropriate  CRANIAL NERVE:   2nd - no papilledema on fundoscopic exam  2nd, 3rd, 4th, 6th - pupils equal and reactive to light, visual fields full to confrontation, extraocular muscles intact, no nystagmus  5th - facial sensation symmetric  7th - facial strength symmetric  8th - hearing intact  9th - palate elevates symmetrically, uvula midline  11th - shoulder shrug symmetric  12th - tongue protrusion midline  MOTOR:   normal bulk and tone, full strength in the BUE, BLE  SENSORY:   normal and symmetric to light touch, temperature, vibration  COORDINATION:   finger-nose-finger, fine finger movements normal  REFLEXES:   deep tendon reflexes present and symmetric  GAIT/STATION:   narrow based gait; romberg is negative    DIAGNOSTIC DATA (LABS, IMAGING, TESTING) - I reviewed patient records, labs, notes, testing and imaging myself where available.  Lab Results  Component Value Date   WBC 9.9 02/27/2020   HGB 12.6  02/27/2020   HCT 41.2 02/27/2020   MCV 93.8 02/27/2020   PLT 198 02/27/2020      Component Value Date/Time   NA 140 08/16/2020 1130   K 4.1 08/16/2020 1130   CL 106 08/16/2020 1130   CO2 22 08/16/2020 1130   GLUCOSE 83 08/16/2020 1130   GLUCOSE 81 02/27/2020 1330   BUN 8 08/16/2020 1130   CREATININE 0.58 08/16/2020 1130   CALCIUM 9.5 08/16/2020 1130   PROT 7.6 06/07/2018 1045   ALBUMIN 4.3 06/07/2018 1045   AST 24 06/07/2018 1045   ALT 35 06/07/2018 1045   ALKPHOS 71 06/07/2018 1045   BILITOT 1.0 06/07/2018 1045   GFRNONAA 132 08/16/2020 1130   GFRAA 152 08/16/2020 1130   No results found for: CHOL, HDL, LDLCALC, LDLDIRECT, TRIG, CHOLHDL No results found for: HGBA1C No results found for: VITAMINB12 Lab Results  Component Value Date   TSH 0.578 08/06/2020     04/25/20 MRI brain [I reviewed images myself and agree with interpretation. Normal pituitary. -VRP]  1. Few scattered foci of T2/FLAIR hyperintensity involving the periventricular and subcortical white matter both cerebral hemispheres, nonspecific. Differential considerations are broad, and include sequelae of accelerated/hereditary chronic small-vessel ischemia, changes related to prior trauma, hypercoagulable state, vasculitis, sequelae of complicated migraines, prior infectious or inflammatory process, or possibly demyelination. No associated enhancement. 2. Otherwise unremarkable and normal brain MRI.   05/24/20 MRI cervical / thoracic spine - normal     ASSESSMENT AND PLAN  19 y.o. year old female here with:   Dx:  1. Postural dizziness with presyncope   2. Lightheaded      PLAN:  PRE-SYNCOPE - increase fluid intake - try to eat 3x per day - monitor BP - follow up with allergy / immunology re: food allergies - follow up with PCP re: pre-syncope  Return for return to PCP.    Penni Bombard, MD 06/25/1949, 9:32 PM Certified in Neurology, Neurophysiology and Neuroimaging  [facility] [address], [place], [place] [address] [phone]

## 2020-09-06 ENCOUNTER — Other Ambulatory Visit: Payer: Managed Care, Other (non HMO)

## 2021-01-02 ENCOUNTER — Ambulatory Visit: Payer: Managed Care, Other (non HMO) | Admitting: Obstetrics and Gynecology

## 2021-02-12 ENCOUNTER — Ambulatory Visit: Payer: Managed Care, Other (non HMO) | Admitting: Obstetrics and Gynecology

## 2021-02-12 ENCOUNTER — Other Ambulatory Visit: Payer: Self-pay

## 2021-02-12 ENCOUNTER — Encounter: Payer: Self-pay | Admitting: Obstetrics and Gynecology

## 2021-02-12 VITALS — BP 122/64 | HR 82 | Ht 67.5 in | Wt 178.0 lb

## 2021-02-12 DIAGNOSIS — G4452 New daily persistent headache (NDPH): Secondary | ICD-10-CM

## 2021-02-12 DIAGNOSIS — D229 Melanocytic nevi, unspecified: Secondary | ICD-10-CM

## 2021-02-12 NOTE — Progress Notes (Signed)
GYNECOLOGY  VISIT   HPI: 20 y.o.   Single Black or African American Not Hispanic or Latino  female   G0P0000 with Patient's last menstrual period was 01/26/2021.   here for patient states that she has a mark on her right breast that just came up one day. She states that she has been having headaches and problems sleeping. The headaches are on the left side behind her ear and can get up to an 8/10 in severity.    She noticed a dark mark on her right breast 3 weeks, hasn't changed. No tender, not draining.   She has been having daily headaches every day for the last 2 weeks, she wakes up with them and come back at night. Needing to take advil.   GYNECOLOGIC HISTORY: Patient's last menstrual period was 01/26/2021. Contraception:none not sexually active Menopausal hormone therapy: none         OB History    Gravida  0   Para  0   Term  0   Preterm  0   AB  0   Living  0     SAB  0   IAB  0   Ectopic  0   Multiple  0   Live Births  0              Patient Active Problem List   Diagnosis Date Noted  . Gastroesophageal reflux disease without esophagitis 08/05/2015  . Irritable bowel syndrome with diarrhea 08/05/2015    Past Medical History:  Diagnosis Date  . Allergy-induced asthma   . Asthma   . Balance problem   . GERD (gastroesophageal reflux disease)   . Hyperkalemia   . Near syncope   . Urinary frequency   . Vision disturbance     History reviewed. No pertinent surgical history.  Current Outpatient Medications  Medication Sig Dispense Refill  . albuterol (PROVENTIL HFA;VENTOLIN HFA) 108 (90 Base) MCG/ACT inhaler Inhale 1-2 puffs into the lungs every 4 (four) hours as needed for wheezing or shortness of breath. 1 Inhaler 0  . beclomethasone (QVAR) 40 MCG/ACT inhaler Inhale 1 puff into the lungs 2 (two) times daily.    Marland Kitchen EPINEPHrine 0.3 mg/0.3 mL IJ SOAJ injection Inject 0.3 mg into the muscle once as needed (allergic reaction).     . famotidine  (PEPCID) 20 MG tablet Take 1 tablet (20 mg total) by mouth 2 (two) times daily. 30 tablet 0  . ibuprofen (ADVIL) 800 MG tablet Take 1 tablet (800 mg total) by mouth every 8 (eight) hours as needed. 30 tablet 1  . loratadine (CLARITIN) 10 MG tablet Take 10 mg by mouth daily as needed for allergies.    . Multiple Vitamins-Minerals (SUPER THERA VITE M) TABS Take by mouth daily.    Marland Kitchen nystatin-triamcinolone ointment (MYCOLOG) Apply thinly to external skin twice daily for 5 days 30 g 0  . predniSONE (DELTASONE) 20 MG tablet 3 tabs po daily x 3 days, then 2 tabs x 3 days, then 1.5 tabs x 3 days, then 1 tab x 3 days, then 0.5 tabs x 3 days 27 tablet 0  . budesonide (RHINOCORT ALLERGY) 32 MCG/ACT nasal spray      No current facility-administered medications for this visit.     ALLERGIES: Other, Other, and Shellfish allergy  Family History  Problem Relation Age of Onset  . Asthma Mother   . Diabetes Father   . Multiple sclerosis Father   . Diabetes Paternal Grandmother  Social History   Socioeconomic History  . Marital status: Single    Spouse name: Not on file  . Number of children: Not on file  . Years of education: Not on file  . Highest education level: Some college, no degree  Occupational History  . Not on file  Tobacco Use  . Smoking status: Never Smoker  . Smokeless tobacco: Never Used  Vaping Use  . Vaping Use: Never used  Substance and Sexual Activity  . Alcohol use: No  . Drug use: No  . Sexual activity: Never    Birth control/protection: None  Other Topics Concern  . Not on file  Social History Narrative          Social Determinants of Health   Financial Resource Strain: Not on file  Food Insecurity: Not on file  Transportation Needs: Not on file  Physical Activity: Not on file  Stress: Not on file  Social Connections: Not on file  Intimate Partner Violence: Not on file    Review of Systems  All other systems reviewed and are negative.   PHYSICAL  EXAMINATION:    BP 122/64   Pulse 82   Ht 5' 7.5" (1.715 m)   Wt 178 lb (80.7 kg)   LMP 01/26/2021   SpO2 99%   BMI 27.47 kg/m     General appearance: alert, cooperative and appears stated age Breasts: normal appearance, no masses or tenderness, she has mild increase in pigmentation on the right breast at 8 o'clock. 1 cm area, not raised, benign appearing.  Axilla: no adenopathy .  1. Nevus Benign, patient reassured  2. New daily persistent headache Under a lot of stress at school which could be the cause Discussed staying well hydrated, getting rest Can take tylenol or ibuprofen Recommended she f/u with primary or neurology

## 2021-02-12 NOTE — Patient Instructions (Addendum)
General Headache Without Cause A headache is pain or discomfort that is felt around the head or neck area. There are many causes and types of headaches. In some cases, the cause may not be found. Follow these instructions at home: Watch your condition for any changes. Let your doctor know about them. Take these steps to help with your condition: Managing pain  Take over-the-counter and prescription medicines only as told by your doctor.  Lie down in a dark, quiet room when you have a headache.  If told, put ice on your head and neck area: ? Put ice in a plastic bag. ? Place a towel between your skin and the bag. ? Leave the ice on for 20 minutes, 2-3 times per day.  If told, put heat on the affected area. Use the heat source that your doctor recommends, such as a moist heat pack or a heating pad. ? Place a towel between your skin and the heat source. ? Leave the heat on for 20-30 minutes. ? Remove the heat if your skin turns bright red. This is very important if you are unable to feel pain, heat, or cold. You may have a greater risk of getting burned.  Keep lights dim if bright lights bother you or make your headaches worse.      Eating and drinking  Eat meals on a regular schedule.  If you drink alcohol: ? Limit how much you use to:  0-1 drink a day for women.  0-2 drinks a day for men. ? Be aware of how much alcohol is in your drink. In the [place] one drink equals one 12 oz bottle of beer (355 mL), one 5 oz glass of wine (148 mL), or one 1 oz glass of hard liquor (44 mL).  Stop drinking caffeine, or reduce how much caffeine you drink. General instructions  Keep a journal to find out if certain things bring on headaches. For example, write down: ? What you eat and drink. ? How much sleep you get. ? Any change to your diet or medicines.  Get a massage or try other ways to relax.  Limit stress.  Sit up straight. Do not tighten (tense) your muscles.  Do not use any  products that contain nicotine or tobacco. This includes cigarettes, e-cigarettes, and chewing tobacco. If you need help quitting, ask your doctor.  Exercise regularly as told by your doctor.  Get enough sleep. This often means 7-9 hours of sleep each night.  Keep all follow-up visits as told by your doctor. This is important.   Contact a doctor if:  Your symptoms are not helped by medicine.  You have a headache that feels different than the other headaches.  You feel sick to your stomach (nauseous) or you throw up (vomit).  You have a fever. Get help right away if:  Your headache gets very bad quickly.  Your headache gets worse after a lot of physical activity.  You keep throwing up.  You have a stiff neck.  You have trouble seeing. You  Mole A mole is a colored (pigmented) growth on the skin. Moles are very common. They are usually harmless, but some moles can become cancerous over time. What are the causes? Moles are caused when pigmented skin cells grow together in clusters instead of spreading out in the skin as they normally do. The reason why the skin cells grow together in clusters is not known. What increases the risk? You are more likely to develop  a mole if you: Have family members who have moles. Are white. Have blond hair. Are often outdoors and exposed to the sun. Received phototherapy when you were a newborn baby. Are female. What are the signs or symptoms? A mole may be: [place] or black. Flat or raised. Smooth or wrinkled.   How is this diagnosed? A mole is diagnosed with a skin exam. If your health care provider thinks a mole may be cancerous, all or part of the mole will be removed for testing (biopsy). How is this treated? Most moles are noncancerous (benign) and do not require treatment. If a mole is found to be cancerous, it will be removed. You may also choose to have a mole removed if it is causing pain or if you do not like the way it  looks. Follow these instructions at home: General instructions Every month, look for new moles and check your existing moles for changes. This is important because a change in a mole can mean that the mole has become cancerous. ABCDE changes in a mole indicate that you should be evaluated by your health care provider. ABCDE stands for: Asymmetry. This means the mole has an irregular shape. It is not round or oval. Border. This means the mole has an irregular or bumpy border. Color. This means the mole has multiple colors in it, including brown, black, blue, red, or tan. Note that it is normal for moles to get darker when a woman is pregnant or takes birth control pills. Diameter. This means the mole is more than 0.2 inches (6 mm) across. Evolving. This refers to any unusual changes or symptoms in the mole, such as pain, itching, stinging, sensitivity, or bleeding. If you have a large number of moles, see a skin doctor (dermatologist) at least one time every year for a full-body skin check.   Lifestyle When you are outdoors, wear sunscreen with SPF 30 (sun protection factor 30) or higher. Use an adequate amount of sunscreen to cover exposed areas of skin. Put it on 30 minutes before you go out. Reapply it every 2 hours or anytime you come out of the water. When you are out in the sun, wear a broad-brimmed hat and clothing that covers your arms and legs. Wear wraparound sunglasses.   Contact a health care provider if: The size, shape, borders, or color of your mole changes. Your mole, or the skin near the mole, becomes painful, sore, red, or swollen. Your mole: Develops more than one color. Itches or bleeds. Becomes scaly, sheds skin, or oozes fluid. Becomes flat or develops raised areas. Becomes hard or soft. You develop a new mole. Summary A mole is a colored (pigmented) growth on the skin. Moles are very common. They are usually harmless, but some moles can become cancerous over  time. Every month, look for new moles and check your existing moles for changes. This is important because a change in a mole can mean that the mole has become cancerous. If you have a large number of moles, see a skin doctor (dermatologist) at least one time every year for a full-body skin check. When you are outdoors, wear sunscreen with SPF 30 (sun protection factor 30) or higher. Reapply it every 2 hours or anytime you come out of the water. Contact a health care provider if you notice changes in a mole or if you develop a new mole. This information is not intended to replace advice given to you by your health care provider. Make  sure you discuss any questions you have with your health care provider. Document Revised: 07/13/2019 Document Reviewed: 05/03/2018 Elsevier Patient Education  2021 [employer].  have trouble speaking.  You have pain in the eye or ear.  Your muscles are weak or you lose muscle control.  You lose your balance or have trouble walking.  You feel like you will pass out (faint) or you pass out.  You are mixed up (confused).  You have a seizure. Summary  A headache is pain or discomfort that is felt around the head or neck area.  There are many causes and types of headaches. In some cases, the cause may not be found.  Keep a journal to help find out what causes your headaches. Watch your condition for any changes. Let your doctor know about them.  Contact a doctor if you have a headache that is different from usual, or if your headache is not helped by medicine.  Get help right away if your headache gets very bad, you throw up, you have trouble seeing, you lose your balance, or you have a seizure. This information is not intended to replace advice given to you by your health care provider. Make sure you discuss any questions you have with your health care provider. Document Revised: 06/27/2018 Document Reviewed: 06/27/2018 Elsevier Patient Education  [place].

## 2021-02-18 ENCOUNTER — Telehealth: Payer: Self-pay | Admitting: Diagnostic Neuroimaging

## 2021-02-18 NOTE — Telephone Encounter (Signed)
Pt. states she's having frequent headaches & wants to be seen by Dr. Please advise

## 2021-02-18 NOTE — Telephone Encounter (Signed)
Called patient and advised her PCP send a new refferal for new problem of headaches. Patient verbalized understanding, appreciation.

## 2021-03-10 NOTE — Telephone Encounter (Signed)
Called patient who stated she has pain from her neck down to her shoulder, radiating up into the sides of her head. This occurs about twice a day x 9 weeks. She takes Advil at night so she can sleep, and it is helpful. We scheduled FU for her, put her on wait list. Patient verbalized understanding, appreciation.

## 2021-03-10 NOTE — Telephone Encounter (Signed)
Pt's mother, Cerena Baine (on [place]) called, my daughter did not know how to word her response. She does not need a referral for headaches. I Deneise Lever) informed Ms. Treadwell of telephone note previously entered.  She also needs a follow up for her cervical spine.  Pt ask to speak with the nurse. Would like a call from the nurse.

## 2021-03-13 ENCOUNTER — Ambulatory Visit: Payer: Managed Care, Other (non HMO) | Admitting: Allergy & Immunology

## 2021-04-07 ENCOUNTER — Encounter: Payer: Self-pay | Admitting: Diagnostic Neuroimaging

## 2021-04-07 ENCOUNTER — Ambulatory Visit (INDEPENDENT_AMBULATORY_CARE_PROVIDER_SITE_OTHER): Payer: Managed Care, Other (non HMO) | Admitting: Diagnostic Neuroimaging

## 2021-04-07 VITALS — BP 112/68 | HR 78 | Ht 67.0 in | Wt 175.0 lb

## 2021-04-07 DIAGNOSIS — M542 Cervicalgia: Secondary | ICD-10-CM | POA: Diagnosis not present

## 2021-04-07 DIAGNOSIS — R51 Headache with orthostatic component, not elsewhere classified: Secondary | ICD-10-CM | POA: Diagnosis not present

## 2021-04-07 NOTE — Patient Instructions (Signed)
NECK PAIN (radiating to head and shoulders) - check MRI brain (positional headaches) and cervical spine (neck / shoulder symptoms) - refer to PT evaluation

## 2021-04-07 NOTE — Progress Notes (Signed)
[facility]  PATIENT: Kari Carrillo DOB: Jan 21, 2001  REFERRING CLINICIAN: Janie Morning, DO HISTORY FROM: patient  REASON FOR VISIT: follow up   HISTORICAL  CHIEF COMPLAINT:  Chief Complaint  Patient presents with  . Neck pain    Rm 7  mom- Seth Bake "neck pain radiates to shoulders and up into my head every day x 12 weeks, Tizanidine helped me sleep, frequency hasn't increased but pain has increased over time"    HISTORY OF PRESENT ILLNESS:   UPDATE (08/21/20, [facility]P): Since last visit, doing well, except for occ pre-syncope events (2 per month). Trying to eat and drink more, but often skips breakfast and has restrictions due to food allergies, sensitivities and preference. Had  Some irregular menses, elevated prolactin level, and wanted to review MRI brain from prior (pituitary review). More stress at school.   PRIOR HPI: 20 year old female here for evaluation of lightheadedness and dizziness.  02/27/2020 patient was at home.  Her father had a syncopal event and had to go to the hospital.  Later that morning she was at the sink washing dishes when all of a sudden she felt lightheaded and dizzy.  Her mother was there and helped her sit down.  Patient briefly had tunnel vision but did not fully lose consciousness.  EMS called to patient's home and initial blood pressure was 64/48.  She was having nausea and headaches.  Blood pressure improved to 99/62 upon arrival to ER.  Since that time patient has had follow-up with PCP and MRI of the brain.  Few nonspecific T2 hyperintensities were noted.  Patient's father has multiple sclerosis (also patient of mine) and therefore consultation was requested.  Patient has been having some intermittent balance issues urinary frequency issues.  Also having some intermittent visual changes.   REVIEW OF SYSTEMS: Full 14 system review of systems performed and negative with exception of: as per HPI.   ALLERGIES: Allergies  Allergen  Reactions  . Other Anaphylaxis, Shortness Of Breath and Other (See Comments)    Raw fruit, A1 sauce- throat closes  . Other Anaphylaxis and Other (See Comments)    *per pt* all fruits and vegetables that grow on trees- including medication containing these products.   Marland Kitchen Shellfish Allergy Anaphylaxis    HOME MEDICATIONS: Outpatient Medications Prior to Visit  Medication Sig Dispense Refill  . albuterol (PROVENTIL HFA;VENTOLIN HFA) 108 (90 Base) MCG/ACT inhaler Inhale 1-2 puffs into the lungs every 4 (four) hours as needed for wheezing or shortness of breath. 1 Inhaler 0  . beclomethasone (QVAR) 40 MCG/ACT inhaler Inhale 1 puff into the lungs 2 (two) times daily.    . budesonide (RHINOCORT AQUA) 32 MCG/ACT nasal spray     . EPINEPHrine 0.3 mg/0.3 mL IJ SOAJ injection Inject 0.3 mg into the muscle once as needed (allergic reaction).     . famotidine (PEPCID) 20 MG tablet Take 1 tablet (20 mg total) by mouth 2 (two) times daily. 30 tablet 0  . ibuprofen (ADVIL) 800 MG tablet Take 1 tablet (800 mg total) by mouth every 8 (eight) hours as needed. 30 tablet 1  . loratadine (CLARITIN) 10 MG tablet Take 10 mg by mouth daily as needed for allergies.    . Multiple Vitamins-Minerals (SUPER THERA VITE M) TABS Take by mouth daily.    Marland Kitchen nystatin-triamcinolone ointment (MYCOLOG) Apply thinly to external skin twice daily for 5 days 30 g 0  . tiZANidine (ZANAFLEX) 2 MG tablet Take 2 mg by mouth at bedtime as  needed.    . predniSONE (DELTASONE) 20 MG tablet 3 tabs po daily x 3 days, then 2 tabs x 3 days, then 1.5 tabs x 3 days, then 1 tab x 3 days, then 0.5 tabs x 3 days 27 tablet 0   No facility-administered medications prior to visit.    PAST MEDICAL HISTORY: Past Medical History:  Diagnosis Date  . Allergy-induced asthma   . Asthma   . Balance problem   . GERD (gastroesophageal reflux disease)   . Hyperkalemia   . Near syncope   . Urinary frequency   . Vision disturbance     PAST SURGICAL  HISTORY: No past surgical history on file.  FAMILY HISTORY: Family History  Problem Relation Age of Onset  . Asthma Mother   . Diabetes Father   . Multiple sclerosis Father   . Diabetes Paternal Grandmother     SOCIAL HISTORY: Social History   Socioeconomic History  . Marital status: Single    Spouse name: Not on file  . Number of children: Not on file  . Years of education: Not on file  . Highest education level: Some college, no degree  Occupational History  . Not on file  Tobacco Use  . Smoking status: Never Smoker  . Smokeless tobacco: Never Used  Vaping Use  . Vaping Use: Never used  Substance and Sexual Activity  . Alcohol use: No  . Drug use: No  . Sexual activity: Never    Birth control/protection: None  Other Topics Concern  . Not on file  Social History Narrative          Social Determinants of Health   Financial Resource Strain: Not on file  Food Insecurity: Not on file  Transportation Needs: Not on file  Physical Activity: Not on file  Stress: Not on file  Social Connections: Not on file  Intimate Partner Violence: Not on file     PHYSICAL EXAM  GENERAL EXAM/CONSTITUTIONAL: Vitals:  Vitals:   04/07/21 1538  BP: 112/68  Pulse: 78  Weight: 175 lb (79.4 kg)  Height: 5\' 7"  (1.702 m)   Body mass index is 27.41 kg/m. Wt Readings from Last 3 Encounters:  04/07/21 175 lb (79.4 kg)  02/12/21 178 lb (80.7 kg)  08/21/20 180 lb (81.6 kg)    Patient is in no distress; well developed, nourished and groomed; neck is supple  CARDIOVASCULAR:  Examination of carotid arteries is normal; no carotid bruits  Regular rate and rhythm, no murmurs  Examination of peripheral vascular system by observation and palpation is normal  EYES:  Ophthalmoscopic exam of optic discs and posterior segments is normal; no papilledema or hemorrhages No exam data present  MUSCULOSKELETAL:  Gait, strength, tone, movements noted in Neurologic exam  below  NEUROLOGIC: MENTAL STATUS:  No flowsheet data found.  awake, alert, oriented to person, place and time  recent and remote memory intact  normal attention and concentration  language fluent, comprehension intact, naming intact  fund of knowledge appropriate  CRANIAL NERVE:   2nd - no papilledema on fundoscopic exam  2nd, 3rd, 4th, 6th - pupils equal and reactive to light, visual fields full to confrontation, extraocular muscles intact, no nystagmus  5th - facial sensation symmetric  7th - facial strength symmetric  8th - hearing intact  9th - palate elevates symmetrically, uvula midline  11th - shoulder shrug symmetric  12th - tongue protrusion midline  MOTOR:   normal bulk and tone, full strength in the BUE, BLE  SENSORY:   normal and symmetric to light touch, temperature, vibration  COORDINATION:   finger-nose-finger, fine finger movements normal  REFLEXES:   deep tendon reflexes present and symmetric  GAIT/STATION:   narrow based gait; romberg is negative    DIAGNOSTIC DATA (LABS, IMAGING, TESTING) - I reviewed patient records, labs, notes, testing and imaging myself where available.  Lab Results  Component Value Date   WBC 9.9 02/27/2020   HGB 12.6 02/27/2020   HCT 41.2 02/27/2020   MCV 93.8 02/27/2020   PLT 198 02/27/2020      Component Value Date/Time   NA 140 08/16/2020 1130   K 4.1 08/16/2020 1130   CL 106 08/16/2020 1130   CO2 22 08/16/2020 1130   GLUCOSE 83 08/16/2020 1130   GLUCOSE 81 02/27/2020 1330   BUN 8 08/16/2020 1130   CREATININE 0.58 08/16/2020 1130   CALCIUM 9.5 08/16/2020 1130   PROT 7.6 06/07/2018 1045   ALBUMIN 4.3 06/07/2018 1045   AST 24 06/07/2018 1045   ALT 35 06/07/2018 1045   ALKPHOS 71 06/07/2018 1045   BILITOT 1.0 06/07/2018 1045   GFRNONAA 132 08/16/2020 1130   GFRAA 152 08/16/2020 1130   No results found for: CHOL, HDL, LDLCALC, LDLDIRECT, TRIG, CHOLHDL No results found for: HGBA1C No  results found for: VITAMINB12 Lab Results  Component Value Date   TSH 0.578 08/06/2020     04/25/20 MRI brain [I reviewed images myself and agree with interpretation. Normal pituitary. -VRP]  1. Few scattered foci of T2/FLAIR hyperintensity involving the periventricular and subcortical white matter both cerebral hemispheres, nonspecific. Differential considerations are broad, and include sequelae of accelerated/hereditary chronic small-vessel ischemia, changes related to prior trauma, hypercoagulable state, vasculitis, sequelae of complicated migraines, prior infectious or inflammatory process, or possibly demyelination. No associated enhancement. 2. Otherwise unremarkable and normal brain MRI.   05/24/20 MRI cervical / thoracic spine - normal     ASSESSMENT AND PLAN  20 y.o. year old female here with:   Dx:  1. Neck pain   2. Positional headache      PLAN:  NECK PAIN (radiating to head and shoulders) - check MRI brain (positional headaches) and cervical spine (neck / shoulder symptoms) - refer to PT evaluation; consider massage or acupuncture treatments - reviewed sleep and stress management  Orders Placed This Encounter  Procedures  . MR BRAIN WO CONTRAST  . MR CERVICAL SPINE WO CONTRAST  . Ambulatory referral to Physical Therapy   Return for pending if symptoms worsen or fail to improve.    Penni Bombard, MD 5/63/8937, 3:42 PM Certified in Neurology, Neurophysiology and Neuroimaging  [facility] [address], [place], [place] [address] [phone]

## 2021-04-08 ENCOUNTER — Telehealth: Payer: Self-pay | Admitting: Diagnostic Neuroimaging

## 2021-04-08 NOTE — Telephone Encounter (Signed)
[employer] order sent to GI. They will obtain the auth and reach out to the patient to schedule.  

## 2021-04-17 ENCOUNTER — Ambulatory Visit
Admission: RE | Admit: 2021-04-17 | Discharge: 2021-04-17 | Disposition: A | Discharge status: Home / Self Care | Payer: Managed Care, Other (non HMO) | Source: Ambulatory Visit | Attending: Diagnostic Neuroimaging | Admitting: Diagnostic Neuroimaging

## 2021-04-17 DIAGNOSIS — M542 Cervicalgia: Secondary | ICD-10-CM

## 2021-04-17 DIAGNOSIS — R51 Headache with orthostatic component, not elsewhere classified: Secondary | ICD-10-CM

## 2021-04-21 ENCOUNTER — Telehealth: Payer: Self-pay

## 2021-04-21 NOTE — Telephone Encounter (Signed)
-----   Message from Vikram R Penumalli, MD sent at 04/21/2021  4:10 PM EDT ----- Unremarkable imaging results. Please call patient. Continue current plan. -VRP 

## 2021-04-21 NOTE — Telephone Encounter (Signed)
I called patient, left a detailed message on cell phone voicemail, per DPR advising her that her MRI brain and cervical spine came back unremarkable.  Dr. Leta Baptist recommends the patient continue current treatment plan. I asked patient to call back if she has further questions or concerns.

## 2021-04-21 NOTE — Telephone Encounter (Signed)
-----   Message from Penni Bombard, MD sent at 04/21/2021  4:10 PM EDT ----- Unremarkable imaging results. Please call patient. Continue current plan. -VRP

## 2021-04-26 ENCOUNTER — Other Ambulatory Visit: Payer: Self-pay

## 2021-04-26 ENCOUNTER — Emergency Department (HOSPITAL_BASED_OUTPATIENT_CLINIC_OR_DEPARTMENT_OTHER)
Admission: EM | Admit: 2021-04-26 | Discharge: 2021-04-26 | Disposition: A | Discharge status: Home / Self Care | Payer: Managed Care, Other (non HMO) | Attending: Emergency Medicine | Admitting: Emergency Medicine

## 2021-04-26 ENCOUNTER — Encounter (HOSPITAL_BASED_OUTPATIENT_CLINIC_OR_DEPARTMENT_OTHER): Payer: Self-pay

## 2021-04-26 ENCOUNTER — Emergency Department (HOSPITAL_BASED_OUTPATIENT_CLINIC_OR_DEPARTMENT_OTHER): Payer: Managed Care, Other (non HMO) | Admitting: Radiology

## 2021-04-26 DIAGNOSIS — J45909 Unspecified asthma, uncomplicated: Secondary | ICD-10-CM | POA: Diagnosis not present

## 2021-04-26 DIAGNOSIS — Y9339 Activity, other involving climbing, rappelling and jumping off: Secondary | ICD-10-CM | POA: Diagnosis not present

## 2021-04-26 DIAGNOSIS — W228XXA Striking against or struck by other objects, initial encounter: Secondary | ICD-10-CM | POA: Insufficient documentation

## 2021-04-26 DIAGNOSIS — S99911A Unspecified injury of right ankle, initial encounter: Secondary | ICD-10-CM | POA: Diagnosis present

## 2021-04-26 DIAGNOSIS — Z7951 Long term (current) use of inhaled steroids: Secondary | ICD-10-CM | POA: Insufficient documentation

## 2021-04-26 DIAGNOSIS — S9002XA Contusion of left ankle, initial encounter: Secondary | ICD-10-CM

## 2021-04-26 NOTE — ED Provider Notes (Signed)
[facility] Provider Note   CSN: [id number] Arrival date & time: 04/26/21  1134     History Chief Complaint  Patient presents with  . Ankle Pain    Kari Carrillo is a 20 y.o. female.  The history is provided by the patient.  Ankle Pain Location:  Ankle Time since incident:  12 hours Injury: yes   Mechanism of injury comment:  Struck her left ankle on the couch while trying to climb over it Ankle location:  L ankle Pain details:    Quality:  Throbbing   Radiates to:  Does not radiate   Severity:  Moderate   Onset quality:  Sudden   Timing:  Constant   Progression:  Unchanged Chronicity:  New Prior injury to area:  No Relieved by:  Nothing Worsened by:  Bearing weight Ineffective treatments:  Acetaminophen, ice and elevation Associated symptoms: decreased ROM and swelling   Associated symptoms: no back pain and no fever        Past Medical History:  Diagnosis Date  . Allergy-induced asthma   . Asthma   . Balance problem   . GERD (gastroesophageal reflux disease)   . Hyperkalemia   . Near syncope   . Urinary frequency   . Vision disturbance     Patient Active Problem List   Diagnosis Date Noted  . Gastroesophageal reflux disease without esophagitis 08/05/2015  . Irritable bowel syndrome with diarrhea 08/05/2015    History reviewed. No pertinent surgical history.   OB History    Gravida  0   Para  0   Term  0   Preterm  0   AB  0   Living  0     SAB  0   IAB  0   Ectopic  0   Multiple  0   Live Births  0           Family History  Problem Relation Age of Onset  . Asthma Mother   . Diabetes Father   . Multiple sclerosis Father   . Diabetes Paternal Grandmother     Social History   Tobacco Use  . Smoking status: Never Smoker  . Smokeless tobacco: Never Used  Vaping Use  . Vaping Use: Never used  Substance Use Topics  . Alcohol use: No  . Drug use: No    Home Medications Prior to  Admission medications   Medication Sig Start Date End Date Taking? Authorizing Provider  albuterol (PROVENTIL HFA;VENTOLIN HFA) 108 (90 Base) MCG/ACT inhaler Inhale 1-2 puffs into the lungs every 4 (four) hours as needed for wheezing or shortness of breath. 01/21/16   Harris, Vernie Shanks, PA-C  beclomethasone (QVAR) 40 MCG/ACT inhaler Inhale 1 puff into the lungs 2 (two) times daily.    [provider]  budesonide (RHINOCORT AQUA) 32 MCG/ACT nasal spray     [provider]  EPINEPHrine 0.3 mg/0.3 mL IJ SOAJ injection Inject 0.3 mg into the muscle once as needed (allergic reaction).     [provider]  famotidine (PEPCID) 20 MG tablet Take 1 tablet (20 mg total) by mouth 2 (two) times daily. 09/09/16   Nona Dell, PA-C  ibuprofen (ADVIL) 800 MG tablet Take 1 tablet (800 mg total) by mouth every 8 (eight) hours as needed. 08/06/20   Salvadore Dom, MD  loratadine (CLARITIN) 10 MG tablet Take 10 mg by mouth daily as needed for allergies.    [provider]  Multiple Vitamins-Minerals (SUPER  THERA VITE M) TABS Take by mouth daily.    [provider]  nystatin-triamcinolone ointment Lilyan Gilford) Apply thinly to external skin twice daily for 5 days 06/22/18   Regina Eck, CNM  tiZANidine (ZANAFLEX) 2 MG tablet Take 2 mg by mouth at bedtime as needed. 03/11/21   [provider]    Allergies    Other, Other, and Shellfish allergy  Review of Systems   Review of Systems  Constitutional: Negative for chills and fever.  HENT: Negative for ear pain and sore throat.   Eyes: Negative for pain and visual disturbance.  Respiratory: Negative for cough and shortness of breath.   Cardiovascular: Negative for chest pain and palpitations.  Gastrointestinal: Negative for abdominal pain and vomiting.  Genitourinary: Negative for dysuria and hematuria.  Musculoskeletal: Negative for arthralgias and back pain.  Skin: Negative for color change and  rash.  Neurological: Negative for seizures and syncope.  All other systems reviewed and are negative.   Physical Exam Updated Vital Signs BP 117/73 (BP Location: Right Arm)   Pulse 67   Temp 98.5 F (36.9 C) (Oral)   Resp 14   Ht 5\' 7"  (1.702 m)   Wt 77.1 kg   SpO2 100%   BMI 26.63 kg/m   Physical Exam Vitals and nursing note reviewed.  HENT:     Head: Normocephalic and atraumatic.  Eyes:     General: No scleral icterus. Pulmonary:     Effort: Pulmonary effort is normal. No respiratory distress.  Musculoskeletal:     Cervical back: Normal range of motion.     Comments: There is bruising and mild swelling at the medial malleolus of the left ankle.  There is also focal tenderness to palpation here.  Overall range of motion is normal.  Strength is normal.  Distal pulses are normal.  Skin:    General: Skin is warm and dry.  Neurological:     Mental Status: She is alert.  Psychiatric:        Mood and Affect: Mood normal.     ED Results / Procedures / Treatments   Labs (all labs ordered are listed, but only abnormal results are displayed) Labs Reviewed - No data to display  EKG None  Radiology DG Ankle Complete Left  Result Date: 04/26/2021 CLINICAL DATA:  Medial left ankle pain following an injury last night. EXAM: LEFT ANKLE COMPLETE - 3+ VIEW COMPARISON:  None. FINDINGS: There is no evidence of fracture, dislocation, or joint effusion. There is no evidence of arthropathy or other focal bone abnormality. Soft tissues are unremarkable. IMPRESSION: Normal examination. Electronically Signed   By: Claudie Revering M.D.   On: 04/26/2021 12:52    Procedures Procedures   Medications Ordered in ED Medications - No data to display  ED Course  I have reviewed the triage vital signs and the nursing notes.  Pertinent labs & imaging results that were available during my care of the patient were reviewed by me and considered in my medical decision making (see chart for  details).    MDM Rules/Calculators/A&P                          Kari Carrillo presents with an ankle contusion.  She struck her ankle while trying to climb over the couch.  Tenderness is directly over the medial malleolus, and x-rays are normal.  I think her symptoms are most likely a bony contusion, and less likely but possibly  are representative of a soft tissue bruise.  However, she is having significant pain while walking.  No evidence of ligamentous injury or instability.  The patient is going to [place] with her [place], and she will be doing a lot of walking.  For this reason, she was given a cam walker and crutches.  She was advised on maintaining her ankle range of motion.  Follow-up instructions were given. Final Clinical Impression(s) / ED Diagnoses Final diagnoses:  Contusion of left ankle, initial encounter    Rx / DC Orders ED Discharge Orders    None       Arnaldo Natal, MD 04/26/21 1313

## 2021-04-26 NOTE — ED Triage Notes (Signed)
Injured left ankle while climbing over couch last night, pain/ swelling in left ankle

## 2021-04-26 NOTE — Discharge Instructions (Signed)
Please ice and elevate your ankle.  Make sure to take the boot off at least 2 times per day and perform range of motion activities with your foot.  For instance, you can draw the alphabet with your big toe.  If your symptoms have not started to resolve within 2 weeks, please follow-up with Dr. Raeford Razor.

## 2021-05-02 ENCOUNTER — Other Ambulatory Visit (HOSPITAL_COMMUNITY)
Admission: RE | Admit: 2021-05-02 | Discharge: 2021-05-02 | Disposition: A | Discharge status: Home / Self Care | Payer: Managed Care, Other (non HMO) | Source: Ambulatory Visit | Attending: Obstetrics and Gynecology | Admitting: Obstetrics and Gynecology

## 2021-05-02 ENCOUNTER — Encounter: Payer: Self-pay | Admitting: Obstetrics and Gynecology

## 2021-05-02 ENCOUNTER — Other Ambulatory Visit: Payer: Self-pay

## 2021-05-02 ENCOUNTER — Ambulatory Visit (INDEPENDENT_AMBULATORY_CARE_PROVIDER_SITE_OTHER): Payer: Managed Care, Other (non HMO) | Admitting: Obstetrics and Gynecology

## 2021-05-02 VITALS — BP 120/62 | HR 78 | Resp 14 | Ht 67.5 in | Wt 176.5 lb

## 2021-05-02 DIAGNOSIS — N946 Dysmenorrhea, unspecified: Secondary | ICD-10-CM | POA: Diagnosis not present

## 2021-05-02 DIAGNOSIS — T781XXD Other adverse food reactions, not elsewhere classified, subsequent encounter: Secondary | ICD-10-CM | POA: Insufficient documentation

## 2021-05-02 DIAGNOSIS — Z0000 Encounter for general adult medical examination without abnormal findings: Secondary | ICD-10-CM | POA: Diagnosis not present

## 2021-05-02 DIAGNOSIS — H9312 Tinnitus, left ear: Secondary | ICD-10-CM | POA: Insufficient documentation

## 2021-05-02 DIAGNOSIS — J45909 Unspecified asthma, uncomplicated: Secondary | ICD-10-CM | POA: Insufficient documentation

## 2021-05-02 DIAGNOSIS — H6990 Unspecified Eustachian tube disorder, unspecified ear: Secondary | ICD-10-CM | POA: Insufficient documentation

## 2021-05-02 DIAGNOSIS — F419 Anxiety disorder, unspecified: Secondary | ICD-10-CM | POA: Insufficient documentation

## 2021-05-02 DIAGNOSIS — Z01419 Encounter for gynecological examination (general) (routine) without abnormal findings: Secondary | ICD-10-CM

## 2021-05-02 DIAGNOSIS — Z124 Encounter for screening for malignant neoplasm of cervix: Secondary | ICD-10-CM | POA: Insufficient documentation

## 2021-05-02 DIAGNOSIS — Z833 Family history of diabetes mellitus: Secondary | ICD-10-CM

## 2021-05-02 DIAGNOSIS — J309 Allergic rhinitis, unspecified: Secondary | ICD-10-CM | POA: Insufficient documentation

## 2021-05-02 MED ORDER — IBUPROFEN 800 MG PO TABS: 800.0000 mg | ORAL_TABLET | Freq: Three times a day (TID) | ORAL | 2 refills | Status: DC | PRN

## 2021-05-02 MED ORDER — ONDANSETRON HCL 4 MG PO TABS: 4.0000 mg | ORAL_TABLET | Freq: Three times a day (TID) | ORAL | 0 refills | Status: DC | PRN

## 2021-05-02 NOTE — Patient Instructions (Signed)
EXERCISE   We recommended that you start or continue a regular exercise program for good health. Physical activity is anything that gets your body moving, some is better than none. The CDC recommends 150 minutes per week of Moderate-Intensity Aerobic Activity and 2 or more days of Muscle Strengthening Activity.  Benefits of exercise are limitless: helps weight loss/weight maintenance, improves mood and energy, helps with depression and anxiety, improves sleep, tones and strengthens muscles, improves balance, improves bone density, protects from chronic conditions such as heart disease, high blood pressure and diabetes and so much more. To learn more visit: https://www.cdc.gov/physicalactivity/index.html  DIET: Good nutrition starts with a healthy diet of fruits, vegetables, whole grains, and lean protein sources. Drink plenty of water for hydration. Minimize empty calories, sodium, sweets. For more information about dietary recommendations visit: https://health.gov/our-work/nutrition-physical-activity/dietary-guidelines and https://www.myplate.gov/  ALCOHOL:  Women should limit their alcohol intake to no more than 7 drinks/beers/glasses of wine (combined, not each!) per week. Moderation of alcohol intake to this level decreases your risk of breast cancer and liver damage.  If you are concerned that you may have a problem, or your friends have told you they are concerned about your drinking, there are many resources to help. A well-known program that is free, effective, and available to all people all over the nation is Alcoholics Anonymous.  Check out this site to learn more: https://www.aa.org/   CALCIUM AND VITAMIN D:  Adequate intake of calcium and Vitamin D are recommended for bone health.  You should be getting between 1000-1200 mg of calcium and 800 units of Vitamin D daily between diet and supplements  PAP SMEARS:  Pap smears, to check for cervical cancer or precancers,  have traditionally been  done yearly, scientific advances have shown that most women can have pap smears less often.  However, every woman still should have a physical exam from her gynecologist every year. It will include a breast check, inspection of the vulva and vagina to check for abnormal growths or skin changes, a visual exam of the cervix, and then an exam to evaluate the size and shape of the uterus and ovaries. We will also provide age appropriate advice regarding health maintenance, like when you should have certain vaccines, screening for sexually transmitted diseases, bone density testing, colonoscopy, mammograms, etc.   MAMMOGRAMS:  All women over 40 years old should have a routine mammogram.   COLON CANCER SCREENING: Now recommend starting at age 45. At this time colonoscopy is not covered for routine screening until 50. There are take home tests that can be done between 45-49.   COLONOSCOPY:  Colonoscopy to screen for colon cancer is recommended for all women at age 50.  We know, you hate the idea of the prep.  We agree, BUT, having colon cancer and not knowing it is worse!!  Colon cancer so often starts as a polyp that can be seen and removed at colonscopy, which can quite literally save your life!  And if your first colonoscopy is normal and you have no family history of colon cancer, most women don't have to have it again for 10 years.  Once every ten years, you can do something that may end up saving your life, right?  We will be happy to help you get it scheduled when you are ready.  Be sure to check your insurance coverage so you understand how much it will cost.  It may be covered as a preventative service at no cost, but you should check   your particular policy.      Breast Self-Awareness Breast self-awareness means being familiar with how your breasts look and feel. It involves checking your breasts regularly and reporting any changes to your health care provider. Practicing breast self-awareness is  important. A change in your breasts can be a sign of a serious medical problem. Being familiar with how your breasts look and feel allows you to find any problems early, when treatment is more likely to be successful. All women should practice breast self-awareness, including women who have had breast implants. How to do a breast self-exam One way to learn what is normal for your breasts and whether your breasts are changing is to do a breast self-exam. To do a breast self-exam: Look for Changes  1. Remove all the clothing above your waist. 2. Stand in front of a mirror in a room with good lighting. 3. Put your hands on your hips. 4. Push your hands firmly downward. 5. Compare your breasts in the mirror. Look for differences between them (asymmetry), such as: ? Differences in shape. ? Differences in size. ? Puckers, dips, and bumps in one breast and not the other. 6. Look at each breast for changes in your skin, such as: ? Redness. ? Scaly areas. 7. Look for changes in your nipples, such as: ? Discharge. ? Bleeding. ? Dimpling. ? Redness. ? A change in position. Feel for Changes Carefully feel your breasts for lumps and changes. It is best to do this while lying on your back on the floor and again while sitting or standing in the shower or tub with soapy water on your skin. Feel each breast in the following way:  Place the arm on the side of the breast you are examining above your head.  Feel your breast with the other hand.  Start in the nipple area and make  inch (2 cm) overlapping circles to feel your breast. Use the pads of your three middle fingers to do this. Apply light pressure, then medium pressure, then firm pressure. The light pressure will allow you to feel the tissue closest to the skin. The medium pressure will allow you to feel the tissue that is a little deeper. The firm pressure will allow you to feel the tissue close to the ribs.  Continue the overlapping circles,  moving downward over the breast until you feel your ribs below your breast.  Move one finger-width toward the center of the body. Continue to use the  inch (2 cm) overlapping circles to feel your breast as you move slowly up toward your collarbone.  Continue the up and down exam using all three pressures until you reach your armpit.  Write Down What You Find  Write down what is normal for each breast and any changes that you find. Keep a written record with breast changes or normal findings for each breast. By writing this information down, you do not need to depend only on memory for size, tenderness, or location. Write down where you are in your menstrual cycle, if you are still menstruating. If you are having trouble noticing differences in your breasts, do not get discouraged. With time you will become more familiar with the variations in your breasts and more comfortable with the exam. How often should I examine my breasts? Examine your breasts every month. If you are breastfeeding, the best time to examine your breasts is after a feeding or after using a breast pump. If you menstruate, the best time to   examine your breasts is 5-7 days after your period is over. During your period, your breasts are lumpier, and it may be more difficult to notice changes. When should I see my health care provider? See your health care provider if you notice:  A change in shape or size of your breasts or nipples.  A change in the skin of your breast or nipples, such as a reddened or scaly area.  Unusual discharge from your nipples.  A lump or thick area that was not there before.  Pain in your breasts.  Anything that concerns you.  Oral Contraception Information Oral contraceptive pills (OCPs) are medicines taken by mouth to prevent pregnancy. They work by:  Preventing the ovaries from releasing eggs.  Thickening mucus in the lower part of the uterus (cervix). This prevents sperm from entering the  uterus.  Thinning the lining of the uterus (endometrium). This prevents a fertilized egg from attaching to the endometrium. OCPs are highly effective when taken exactly as prescribed. However, OCPs do not prevent STIs (sexually transmitted infections). Using condoms while on an OCP can help prevent STIs. What happens before starting OCPs? Before you start taking OCPs:  You may have a physical exam, blood test, and Pap test.  Your health care provider will make sure you are a good candidate for oral contraception. OCPs are not a good option for certain women, such as: ? Women who smoke and are older than age 35. ? Women who have or have had certain conditions, such as:  A history of high blood pressure.  Deep vein thrombosis.  Pulmonary embolism.  Stroke.  Cardiovascular disease.  Peripheral vascular disease. Ask your health care provider about the possible side effects of the OCP you may be prescribed. Be aware that it can take 2-3 months for your body to adjust to changes in hormone levels. Types of oral contraception Birth control pills contain the hormones estrogen and progestin (synthetic progesterone) or progestin only. The combination pill This type of pill contains estrogen and progestin hormones.  Conventional contraception pills come in packs of 21 or 28 pills. ? Some packs with 28-day pills contain estrogen and progestin for the first 21-24 days. Hormone-free tablets, called placebos, are taken for the final 4-7 days. You should have menstrual bleeding during the time you take the placebos. ? In packs with 21 tablets, you take no pills for 7 days. Menstrual bleeding occurs during these days. (Some people prefer taking a pill for 28 days to help establish a routine).  Extended-interval contraception pills come in packs of 91 pills. The first 84 tablets have both estrogen and progestin. The last 7 pills are placebos. Menstrual bleeding occurs during the placebo days. With  this schedule, menstrual bleeding happens once every 3 months.  Continuous contraception pills come in packs of 28 pills. All pills in the pack contain estrogen and progestin. With this schedule, regular menstrual bleeding does not happen, but there may be spotting or irregular bleeding. Progestin-only pills This type of pill is often called the mini-pill and contains the progestin hormone only. It comes in packs of 28 pills. In some packs, the last 4 pills are placebos. The pill must be taken at the same time every day. This is very important to prevent pregnancy. Menstrual bleeding may not be regular or predictable.   What are the advantages? Oral contraception provides reliable and continuous contraception if taken as directed. It may treat or decrease symptoms of:  Menstrual period cramps.  Irregular   menstrual cycle or bleeding.  Heavy menstrual flow.  Abnormal uterine bleeding.  Acne, depending on the type of pill.  Polycystic ovarian syndrome (POS).  Endometriosis.  Iron deficiency anemia.  Premenstrual symptoms, including severe irritability, depression, or anxiety. It also may:  Reduce the risk of endometrial and ovarian cancer.  Be used as emergency contraception.  Prevent ectopic pregnancies and infections of the fallopian tubes. What can make OCPs less effective? OCPs may be less effective if:  You forget to take the pill every day. For progestin-only pills, it is especially important to take the pill at the same time each day. Even taking it 3 hours late can increase the risk of pregnancy.  You have a stomach or intestinal disease that reduces your body's ability to absorb the pill.  You take OCPs with other medicines that make OCPs less effective, such as antibiotics, certain HIV medicines, and some seizure medicines.  You take expired OCPs.  You forget to restart the pill after 7 days of not taking it. This refers to the packs of 21 pills. What are the side  effects and risks? OCPs can sometimes cause side effects, such as:  Headache.  Depression.  Trouble sleeping.  Nausea and vomiting.  Breast tenderness.  Irregular bleeding or spotting during the first several months.  Bloating or fluid retention.  Increase in blood pressure. Combination pills may slightly increase the risk of:  Blood clots.  Heart attack.  Stroke. Follow these instructions at home: Follow instructions from your health care provider about how to start taking your first cycle of OCPs. Depending on when you start the pill, you may need to use a backup form of birth control, such as condoms, during the first week. Make sure you know what steps to take if you forget to take the pill. Summary  Oral contraceptive pills (OCPs) are medicines taken by mouth to prevent pregnancy. They are highly effective when taken exactly as prescribed.  OCPs contain a combination of the hormones estrogen and progestin (synthetic progesterone) or progestin only.  Before you start taking the pill, you may have a physical exam, blood test, and Pap test. Your health care provider will make sure you are a good candidate for oral contraception.  The combination pill may come in a 21-day pack, a 28-day pack, or a 91-day pack. Progestin-only pills come in packs of 28 pills.  OCPs can sometimes cause side effects, such as headache, nausea, breast tenderness, or irregular bleeding. This information is not intended to replace advice given to you by your health care provider. Make sure you discuss any questions you have with your health care provider. Document Revised: 09/06/2020 Document Reviewed: 08/15/2020 [employer] Patient Education  2021 [employer]  

## 2021-05-02 NOTE — Progress Notes (Signed)
20 y.o. [place] or African American Not Hispanic or Latino female here for annual exam.   Period Duration (Days): 5-6 Period Pattern: (!) Irregular Menstrual Flow: Light Menstrual Control: Maxi pad Menstrual Control Change Freq (Hours): changes pad when uses bathroom Dysmenorrhea: (!) Severe Dysmenorrhea Symptoms: Nausea,Diarrhea,Cramping  She is having bad diarrhea, nausea and some emesis with the last 2 cycles. The day prior she has bad cramps, not the day of the diarrhea.  Every 2-3 cycles she has severe cramps. Never sexually active.  Generally her cycles are a month apart, one cycle was in 2 weeks (during exams).    She had a mildly elevated prolactin last year, done for irregular cycles. Normal MRI a few months prior to the blood work.   Patient's last menstrual period was 04/25/2021.          Sexually active: No.  The current method of family planning is abstinence.    Exercising: Yes.    dance, tennis, runnig Smoker:  no  Health Maintenance: Pap:  never History of abnormal Pap:  n/a TDaP:  08-05-20 Gardasil: no, discussed, declines.     reports that she has never smoked. She has never used smokeless tobacco. She reports that she does not drink alcohol and does not use drugs. She will be a senior in college this fall, majoring in [place]. She wants to be a Publishing rights manager. She is studying for the [employer], doing research at school.   Past Medical History:  Diagnosis Date  . Allergy-induced asthma   . Asthma   . Balance problem   . GERD (gastroesophageal reflux disease)   . Hyperkalemia   . Near syncope   . Urinary frequency   . Vision disturbance     History reviewed. No pertinent surgical history.  Current Outpatient Medications  Medication Sig Dispense Refill  . albuterol (PROVENTIL HFA;VENTOLIN HFA) 108 (90 Base) MCG/ACT inhaler Inhale 1-2 puffs into the lungs every 4 (four) hours as needed for wheezing or shortness of breath. 1 Inhaler 0  .  beclomethasone (QVAR) 40 MCG/ACT inhaler Inhale 1 puff into the lungs 2 (two) times daily.    . budesonide (RHINOCORT AQUA) 32 MCG/ACT nasal spray     . EPINEPHrine 0.3 mg/0.3 mL IJ SOAJ injection Inject 0.3 mg into the muscle once as needed (allergic reaction).     . famotidine (PEPCID) 20 MG tablet Take 1 tablet (20 mg total) by mouth 2 (two) times daily. 30 tablet 0  . ibuprofen (ADVIL) 800 MG tablet Take 1 tablet (800 mg total) by mouth every 8 (eight) hours as needed. 30 tablet 1  . loratadine (CLARITIN) 10 MG tablet Take 10 mg by mouth daily as needed for allergies.    . Multiple Vitamins-Minerals (SUPER THERA VITE M) TABS Take by mouth daily.    Marland Kitchen nystatin-triamcinolone ointment (MYCOLOG) Apply thinly to external skin twice daily for 5 days 30 g 0  . omeprazole (PRILOSEC OTC) 20 MG tablet Take by mouth.    Marland Kitchen tiZANidine (ZANAFLEX) 2 MG tablet Take 2 mg by mouth at bedtime as needed.     No current facility-administered medications for this visit.    Family History  Problem Relation Age of Onset  . Asthma Mother   . Diabetes Father   . Multiple sclerosis Father   . Diabetes Paternal Grandmother   . Heart attack Paternal Grandmother     Review of Systems  Gastrointestinal: Positive for diarrhea and vomiting.  Neurological: Positive for headaches.  All  other systems reviewed and are negative.   Exam:   BP 120/62 (BP Location: Right Arm, Patient Position: Sitting, Cuff Size: Normal)   Pulse 78   Resp 14   Ht 5' 7.5" (1.715 m)   Wt 176 lb 8 oz (80.1 kg)   LMP 04/25/2021   BMI 27.24 kg/m   Weight change: @WEIGHTCHANGE @ Height:   Height: 5' 7.5" (171.5 cm)  Ht Readings from Last 3 Encounters:  05/02/21 5' 7.5" (1.715 m)  04/26/21 5\' 7"  (1.702 m)  04/07/21 5\' 7"  (1.702 m)    General appearance: alert, cooperative and appears stated age Head: Normocephalic, without obvious abnormality, atraumatic Neck: no adenopathy, supple, symmetrical, trachea midline and thyroid normal  to inspection and palpation Lungs: clear to auscultation bilaterally Cardiovascular: regular rate and rhythm Breasts: normal appearance, no masses or tenderness Abdomen: soft, non-tender; non distended,  no masses,  no organomegaly Extremities: extremities normal, atraumatic, no cyanosis or edema Skin: Skin color, texture, turgor normal. No rashes or lesions Lymph nodes: Cervical, supraclavicular, and axillary nodes normal. No abnormal inguinal nodes palpated Neurologic: Grossly normal   Pelvic: External genitalia:  no lesions              Urethra:  normal appearing urethra with no masses, tenderness or lesions              Bartholins and Skenes: normal                 Vagina: normal appearing vagina with normal color and discharge, no lesions              Cervix: no lesions               Bimanual Exam:  Uterus:  no masses or tenderness              Adnexa: no mass, fullness, tenderness               Rectovaginal: deferred  Karmen Bongo chaperoned for the exam.  1. Well woman exam Discussed breast self exam Discussed calcium and vit D intake One abnormal cycle, during time of increased stress.  Declines gardasil  2. Severe dysmenorrhea Discussed option for OCP's, she declines for now (information given) - ondansetron (ZOFRAN) 4 MG tablet; Take 1 tablet (4 mg total) by mouth every 8 (eight) hours as needed for nausea or vomiting.  Dispense: 20 tablet; Refill: 0 - ibuprofen (ADVIL) 800 MG tablet; Take 1 tablet (800 mg total) by mouth every 8 (eight) hours as needed.  Dispense: 30 tablet; Refill: 2  3. Screening for cervical cancer - Cytology - PAP  4. Laboratory exam ordered as part of routine general medical examination  - CBC - Comprehensive metabolic panel - Lipid panel  5. Family history of diabetes mellitus (DM) - Hemoglobin A1c

## 2021-05-03 LAB — LIPID PANEL
Cholesterol: 155 mg/dL (ref <200)
HDL: 55 mg/dL (ref >=50)
LDL Cholesterol (Calc): 87 mg/dL (calc)
Non-HDL Cholesterol (Calc): 100 mg/dL (calc) (ref <130)
Total CHOL/HDL Ratio: 2.8 (calc) (ref <5.0)
Triglycerides: 44 mg/dL (ref <150)

## 2021-05-03 LAB — COMPREHENSIVE METABOLIC PANEL
AG Ratio: 1.6 (calc) (ref 1.0–2.5)
ALT: 12 U/L (ref 6–29)
AST: 14 U/L (ref 10–30)
Albumin: 4.6 g/dL (ref 3.6–5.1)
Alkaline phosphatase (APISO): 75 U/L (ref 31–125)
BUN: 7 mg/dL (ref 7–25)
CO2: 28 mmol/L (ref 20–32)
Calcium: 9.8 mg/dL (ref 8.6–10.2)
Chloride: 103 mmol/L (ref 98–110)
Creat: 0.58 mg/dL (ref 0.50–1.10)
Globulin: 2.9 g/dL (calc) (ref 1.9–3.7)
Glucose, Bld: 71 mg/dL (ref 65–99)
Potassium: 3.7 mmol/L (ref 3.5–5.3)
Sodium: 139 mmol/L (ref 135–146)
Total Bilirubin: 0.4 mg/dL (ref 0.2–1.2)
Total Protein: 7.5 g/dL (ref 6.1–8.1)

## 2021-05-03 LAB — CBC
HCT: 39.4 % (ref 35.0–45.0)
Hemoglobin: 12.6 g/dL (ref 11.7–15.5)
MCH: 28.1 pg (ref 27.0–33.0)
MCHC: 32 g/dL (ref 32.0–36.0)
MCV: 87.9 fL (ref 80.0–100.0)
MPV: 11.5 fL (ref 7.5–12.5)
Platelets: 320 10*3/uL (ref 140–400)
RBC: 4.48 10*6/uL (ref 3.80–5.10)
RDW: 12 % (ref 11.0–15.0)
WBC: 10.9 10*3/uL — ABNORMAL HIGH (ref 3.8–10.8)

## 2021-05-03 LAB — HEMOGLOBIN A1C
Hgb A1c MFr Bld: 5.3 % of total Hgb (ref <5.7)
Mean Plasma Glucose: 105 mg/dL
eAG (mmol/L): 5.8 mmol/L

## 2021-05-07 LAB — CYTOLOGY - PAP: Diagnosis: NEGATIVE

## 2021-05-15 ENCOUNTER — Encounter: Payer: Self-pay | Admitting: Allergy & Immunology

## 2021-05-15 ENCOUNTER — Ambulatory Visit: Payer: Managed Care, Other (non HMO) | Admitting: Podiatry

## 2021-05-15 ENCOUNTER — Ambulatory Visit (INDEPENDENT_AMBULATORY_CARE_PROVIDER_SITE_OTHER): Payer: Managed Care, Other (non HMO) | Admitting: Allergy & Immunology

## 2021-05-15 ENCOUNTER — Other Ambulatory Visit: Payer: Self-pay

## 2021-05-15 ENCOUNTER — Ambulatory Visit (INDEPENDENT_AMBULATORY_CARE_PROVIDER_SITE_OTHER): Payer: Managed Care, Other (non HMO)

## 2021-05-15 VITALS — BP 114/76 | HR 74 | Temp 98.0°F | Resp 12 | Ht 67.0 in | Wt 177.2 lb

## 2021-05-15 DIAGNOSIS — J302 Other seasonal allergic rhinitis: Secondary | ICD-10-CM

## 2021-05-15 DIAGNOSIS — J3089 Other allergic rhinitis: Secondary | ICD-10-CM | POA: Diagnosis not present

## 2021-05-15 DIAGNOSIS — T781XXD Other adverse food reactions, not elsewhere classified, subsequent encounter: Secondary | ICD-10-CM

## 2021-05-15 DIAGNOSIS — J4520 Mild intermittent asthma, uncomplicated: Secondary | ICD-10-CM

## 2021-05-15 DIAGNOSIS — S9032XA Contusion of left foot, initial encounter: Secondary | ICD-10-CM | POA: Diagnosis not present

## 2021-05-15 DIAGNOSIS — M79604 Pain in right leg: Secondary | ICD-10-CM | POA: Diagnosis not present

## 2021-05-15 MED ORDER — IBUPROFEN 800 MG PO TABS: 800.0000 mg | ORAL_TABLET | Freq: Three times a day (TID) | ORAL | 0 refills | Status: DC | PRN

## 2021-05-15 MED ORDER — BUDESONIDE 32 MCG/ACT NA SUSP
1.0000 | Freq: Two times a day (BID) | NASAL | 0 refills | Status: DC | PRN
Start: 2021-05-15 — End: 2022-12-29

## 2021-05-15 MED ORDER — EPINEPHRINE 0.3 MG/0.3ML IJ SOAJ: 0.3000 mg | Freq: Once | INTRAMUSCULAR | 1 refills | Status: AC

## 2021-05-15 MED ORDER — FEXOFENADINE HCL 180 MG PO TABS: 180.0000 mg | ORAL_TABLET | Freq: Every day | ORAL | 0 refills | Status: DC

## 2021-05-15 MED ORDER — AZELASTINE HCL 0.1 % NA SOLN: 2.0000 | Freq: Two times a day (BID) | NASAL | 0 refills | Status: DC

## 2021-05-15 NOTE — Patient Instructions (Addendum)
1. Mild intermittent asthma, uncomplicated - Lung testing looked great. - We are not going to make any changes today. - Daily controller medication(s): NOTHING - Prior to physical activity: albuterol 2 puffs 10-15 minutes before physical activity. - Rescue medications: albuterol 4 puffs every 4-6 hours as needed - Changes during respiratory infections or worsening symptoms: Add on Qvar 36mcg to 2 puffs twice daily for ONE TO TWO WEEKS. - Asthma control goals:  * Full participation in all desired activities (may need albuterol before activity) * Albuterol use two time or less a week on average (not counting use with activity) * Cough interfering with sleep two time or less a month * Oral steroids no more than once a year * No hospitalizations  2. Seasonal and perennial allergic rhinitis - Testing today showed: mouse, horse, grasses, ragweed, weeds, trees, indoor molds, dust mites, cat, dog and cockroach - Copy of test results provided.  - Avoidance measures provided. - Stop taking: loratadine - Start taking: Rhinocort one spray per nostril twice daily, Allegra (fexofenadine) 180mg  tablet 1-2 times daily and Astelin (azelastine) 2 sprays per nostril 1-2 times daily as needed - You can use an extra dose of the antihistamine, if needed, for breakthrough symptoms.  - Consider nasal saline rinses 1-2 times daily to remove allergens from the nasal cavities as well as help with mucous clearance (this is especially helpful to do before the nasal sprays are given) - Consider allergy shots as a means of long-term control. - Allergy shots "re-train" and "reset" the immune system to ignore environmental allergens and decrease the resulting immune response to those allergens (sneezing, itchy watery eyes, runny nose, nasal congestion, etc).    - Allergy shots improve symptoms in 75-85% of patients.  - We can discuss more at the next appointment if the medications are not working for you.  3. Pollen-food  allergy syndrome - Testing was slightly reactive to peanuts and very reactive to carrot. - I would avoid of these foods that cause throat swelling for sure. [name] training provided. - They should call to confirm the shipping address.  - The oral allergy syndrome (OAS) or pollen-food allergy syndrome (PFAS) is a relatively common form of food allergy, particularly in adults.  - It typically occurs in people who have pollen allergies when the immune system "sees" proteins on the food that look like proteins on the pollen.  - This results in the allergy antibody (IgE) binding to the food instead of the pollen.  - Patients typically report itching and/or mild swelling of the mouth and throat immediately following ingestion of certain uncooked fruits (including nuts) or raw vegetables.  - Only a very small number of affected individuals experience systemic allergic reactions, such as anaphylaxis which occurs with true food allergies (although you are clearly part of that small number!).     4. Return in about 2 months (around 07/15/2021).    Please inform us of any Emergency Department visits, hospitalizations, or changes in symptoms. Call us before going to the ED for breathing or allergy symptoms since we might be able to fit you in for a sick visit. Feel free to contact us anytime with any questions, problems, or concerns.  It was a pleasure to meet you and your family today!  Websites that have reliable patient information: 1. American Academy of Asthma, Allergy, and Immunology: www.aaaai.org 2. Food Allergy Research and Education (FARE): foodallergy.org 3. Mothers of Asthmatics: http://www.asthmacommunitynetwork.org 4. American College of Allergy, Asthma, and Immunology:  www.acaai.org   COVID-19 Vaccine Information can be found at: [email or web address].[email or web address].uk For questions related to vaccine distribution or appointments, please email  [email or web address] or call [phone].   We realize that you might be concerned about having an allergic reaction to the COVID19 vaccines. To help with that concern, WE ARE OFFERING THE COVID19 VACCINES IN OUR OFFICE! Ask the front desk for dates!     "Like" [place] on Facebook and Instagram for our latest updates!      A healthy democracy works best when [employer] participate! Make sure you are registered to vote! If you have moved or changed any of your contact information, you will need to get this updated before voting!  In some cases, you MAY be able to register to vote online: [email or web address]    1. Control-Buffer 50% Glycerol Negative   2. Control-Histamine 1 mg/ml 2+   3. Albumin saline Negative   4. [place] Negative   5. [place] Negative   6. [place] 3+   7. [place] Blue Negative   8. Meadow [place]escue 3+   9. Perennial Rye 3+   10. Sweet Vernal 3+   11. Timothy Negative   12. Cocklebur Negative   13. Burweed Marshelder Negative   14. Ragweed, short Negative   15. Ragweed, Giant Negative   16. Plantain,  English 2+   17. Lamb's Quarters Negative   18. Sheep Sorrell Negative   19. Rough Pigweed Negative   20. Marsh Elder, Rough Negative   21. Mugwort, Common Negative   22. Ash mix 3+   23. Birch mix 3+   24. Beech American 2+   25. Box, Elder 2+   26. Cedar, red 2+   27. Cottonwood, Eastern 3+   28. Elm mix Negative   29. Hickory 3+   30. Maple mix Negative   31. Oak, [place] mix 3+   32. Pecan Pollen 3+   33. Pine mix 2+   34. Sycamore Eastern 2+   35. Winston, Black Pollen 2+   36. Alternaria alternata Negative   37. Cladosporium Herbarum Negative   38. Aspergillus mix Negative   39. Penicillium mix Negative   40. Bipolaris sorokiniana (Helminthosporium) Negative   41. Drechslera spicifera (Curvularia) Negative   42. Mucor plumbeus Negative   43. Fusarium moniliforme Negative   44. Aureobasidium pullulans (pullulara)  Negative   45. Rhizopus oryzae Negative   46. Botrytis cinera Negative   47. Epicoccum nigrum Negative   48. Phoma betae Negative   49. Candida Albicans Negative   50. Trichophyton mentagrophytes Negative   51. Mite, D Farinae  5,000 AU/ml 2+   52. Mite, D Pteronyssinus  5,000 AU/ml 2+   53. Cat Hair 10,000 BAU/ml 2+   54.  Dog Epithelia 3+   55. Mixed Feathers Negative   56. Horse Epithelia 3+   57. Cockroach, German 2+   58. Mouse 3+   59. Tobacco Leaf Negative     Control Negative   [place] 4+   Ragweed mix 4+   Mold 1 Negative   Mold 2 Negative   Mold 3 Negative   Mold 4 1+     1. Peanut --   +/-  5. Milk, cow Negative   6. Egg White, Chicken Negative   15. [place] nut Negative   25. Shrimp Negative   42. Tomato Negative   47. Mushrooms Negative   51. Carrots 3+   59. Peach Negative   69. Ginger Negative     Allergy  Shots   Allergies are the result of a chain reaction that starts in the immune system. Your immune system controls how your body defends itself. For instance, if you have an allergy to pollen, your immune system identifies pollen as an invader or allergen. Your immune system overreacts by producing antibodies called Immunoglobulin E (IgE). These antibodies travel to cells that release chemicals, causing an allergic reaction.  The concept behind allergy immunotherapy, whether it is received in the form of shots or tablets, is that the immune system can be desensitized to specific allergens that trigger allergy symptoms. Although it requires time and patience, the payback can be long-term relief.  How Do Allergy Shots Work?  Allergy shots work much like a vaccine. Your body responds to injected amounts of a particular allergen given in increasing doses, eventually developing a resistance and tolerance to it. Allergy shots can lead to decreased, minimal or no allergy symptoms.  There generally are two phases: build-up and maintenance. Build-up often ranges  from three to six months and involves receiving injections with increasing amounts of the allergens. The shots are typically given once or twice a week, though more rapid build-up schedules are sometimes used.  The maintenance phase begins when the most effective dose is reached. This dose is different for each person, depending on how allergic you are and your response to the build-up injections. Once the maintenance dose is reached, there are longer periods between injections, typically two to four weeks.  Occasionally doctors give cortisone-type shots that can temporarily reduce allergy symptoms. These types of shots are different and should not be confused with allergy immunotherapy shots.  Who Can Be Treated with Allergy Shots?  Allergy shots may be a good treatment approach for people with allergic rhinitis (hay fever), allergic asthma, conjunctivitis (eye allergy) or stinging insect allergy.   Before deciding to begin allergy shots, you should consider:  . The length of allergy season and the severity of your symptoms . Whether medications and/or changes to your environment can control your symptoms . Your desire to avoid long-term medication use . Time: allergy immunotherapy requires a major time commitment . Cost: may vary depending on your insurance coverage  Allergy shots for children age 70 and older are effective and often well tolerated. They might prevent the onset of new allergen sensitivities or the progression to asthma.  Allergy shots are not started on patients who are pregnant but can be continued on patients who become pregnant while receiving them. In some patients with other medical conditions or who take certain common medications, allergy shots may be of risk. It is important to mention other medications you talk to your allergist.   When Will I Feel Better?  Some may experience decreased allergy symptoms during the build-up phase. For others, it may take as long  as 12 months on the maintenance dose. If there is no improvement after a year of maintenance, your allergist will discuss other treatment options with you.  If you aren't responding to allergy shots, it may be because there is not enough dose of the allergen in your vaccine or there are missing allergens that were not identified during your allergy testing. Other reasons could be that there are high levels of the allergen in your environment or major exposure to non-allergic triggers like tobacco smoke.  What Is the Length of Treatment?  Once the maintenance dose is reached, allergy shots are generally continued for three to five years. The decision to stop should  be discussed with your allergist at that time. Some people may experience a permanent reduction of allergy symptoms. Others may relapse and a longer course of allergy shots can be considered.  What Are the Possible Reactions?  The two types of adverse reactions that can occur with allergy shots are local and systemic. Common local reactions include very mild redness and swelling at the injection site, which can happen immediately or several hours after. A systemic reaction, which is less common, affects the entire body or a particular body system. They are usually mild and typically respond quickly to medications. Signs include increased allergy symptoms such as sneezing, a stuffy nose or hives.  Rarely, a serious systemic reaction called anaphylaxis can develop. Symptoms include swelling in the throat, wheezing, a feeling of tightness in the chest, nausea or dizziness. Most serious systemic reactions develop within 30 minutes of allergy shots. This is why it is strongly recommended you wait in your doctor's office for 30 minutes after your injections. Your allergist is trained to watch for reactions, and his or her staff is trained and equipped with the proper medications to identify and treat them.  Who Should Administer Allergy  Shots?  The preferred location for receiving shots is your prescribing allergist's office. Injections can sometimes be given at another facility where the physician and staff are trained to recognize and treat reactions, and have received instructions by your prescribing allergist.

## 2021-05-15 NOTE — Progress Notes (Signed)
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 Mild intermittent asthma, uncomplicated - Lung testing looked great. - We are not going to make any changes today. - Daily controller medication(s): NOTHING - Prior to physical activity: albuterol 2 puffs 10-15 minutes before physical activity. - Rescue medications: albuterol 4 puffs every 4-6 hours as needed - Changes during respiratory infections or worsening symptoms: Add on Qvar 65mcg to 2 puffs twice daily for ONE TO TWO WEEKS. - Asthma control goals:  * Full participation in all desired activities (may need albuterol before activity) * Albuterol use two time or less a week on average (not counting use with activity) * Cough interfering with sleep two time or less a month * Oral steroids no more than once a year * No hospitalizations  2. Seasonal and perennial allergic rhinitis - Testing today showed: mouse, horse, grasses, ragweed, weeds, trees, indoor molds, dust mites, cat, dog and cockroach - Copy of test results provided.  - Avoidance measures provided. - Stop taking: loratadine - Start taking: Rhinocort one spray per nostril twice daily, Allegra (fexofenadine) 180mg  table once daily and Astelin (azelastine) 2 sprays per nostril 1-2 times daily as needed - You can use an extra dose of the antihistamine, if needed, for breakthrough symptoms.  - Consider nasal saline rinses 1-2 times daily to remove allergens from the nasal cavities as well as help with mucous clearance (this is especially helpful to do before the nasal sprays are given) -  Consider allergy shots as a means of long-term control. - Allergy shots "re-train" and "reset" the immune system to ignore environmental allergens and decrease the resulting immune response to those allergens (sneezing, itchy watery eyes, runny nose, nasal congestion, etc).    - Allergy shots improve symptoms in 75-85% of patients.  - We can discuss more at the next appointment if the medications are not working for you.  3. Pollen-food allergy syndrome - Testing was slightly reactive to peanuts and very reactive to carrot. - I would avoid of these foods that cause throat swelling for sure. [name] training provided. - They should call to confirm the shipping address.  - The oral allergy syndrome (OAS) or pollen-food allergy syndrome (PFAS) is a relatively common form of food allergy, particularly in adults.  - It typically occurs in people who have pollen allergies when the immune system "sees" proteins on the food that look like proteins on the pollen.  - This results in the allergy antibody (IgE) binding to the food instead of the pollen.  - Patients typically report itching and/or mild swelling of the mouth and throat immediately following ingestion of certain uncooked fruits (including nuts) or raw vegetables.  - Only a very small number of affected individuals experience systemic allergic reactions, such as anaphylaxis which occurs with true food allergies (although you are clearly part of that small number!).  4. Return in about 2 months (around 07/15/2021).    This note in its entirety was forwarded to the Provider who requested this consultation.  Subjective:   Kari Carrillo is a 20 y.o. female presenting today for evaluation of  Chief Complaint  Patient presents  with  . Allergic Rhinitis   . Food Intolerance    Kari Carrillo has a history of the following: Patient Active Problem List   Diagnosis Date Noted  . Allergic rhinitis 05/02/2021  . Anxiety 05/02/2021  .  Asthma 05/02/2021  . Eustachian tube disorder 05/02/2021  . Food allergy 05/02/2021  . 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 Gastroesophageal reflux disease without esophagitis 08/05/2015  . Irritable bowel syndrome with diarrhea 08/05/2015    History obtained from: chart review and patient and mother.  Kari Carrillo was referred by Janie Morning, DO.     Kari is a 20 y.o. female presenting for an evaluation of environemental allergies and fruit/vegetable allergies.  She moved here 8 years ago. She had a lot of sneezing initially, but then it improved. She avoids particularly items and she avoids all fruits and vegetables "that grow on trees". She is in college and is able to avoid aerosol sprays. She is going to [employer]. She is able to control her environment in college and is able to thrive there.  She had some friends that suggested that she get tested again. She takes loratadine daily. She had a thing a few weeks ago when she would feel something "weird in [her] throat". This is only PRN.    Allergic Rhinitis Symptom History: She was tested over 10 years who when they lived in [place]. She was positive to everything outdoors. She had issues when she was exposed to grass outdoors. She apparently was evaluated because she had "bumps" on her torso. She had a voice change and was going into "anaphylactic shock". She was found to get allergic to fruits and vegetables on trees. She is allergic to "anything in the air". This first episode was when she was exposed to oranges perfume at [facility]. The testing was "overwhelming" for her and her "body was on fire". She says that it was "too much" and didn't go well. Mom had to stop the "torture" and it seems that she did not finish the testing. The story is unclear. Should does get headaches with nose sprays.   Food Allergy Symptom History: She cannot eat cooked fruit. She can eat every vegetable except for grape tomatoes. Sometimes  carrots bother her. She can eat cooked things without a problem.    Eczema Symptom History: She has eczema that she treats with "shingles" cream. She got some systemic pills for her shingles. This was a couple of years ago. She was stressed from school.    She has seen a neurologist and has never been diagnosed with migraines. She was seeing the neurologist for headaches.    Otherwise, there is no history of other atopic diseases, including drug allergies, stinging insect allergies, urticaria or contact dermatitis. There is no significant infectious history. Vaccinations are up to date.    Past Medical History: Patient Active Problem List   Diagnosis Date Noted  . Allergic rhinitis 05/02/2021  . Anxiety 05/02/2021  . Asthma 05/02/2021  . Eustachian tube disorder 05/02/2021  . Food allergy 05/02/2021  . 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 Gastroesophageal reflux disease without esophagitis 08/05/2015  . Irritable bowel syndrome with diarrhea 08/05/2015    Medication List:  Allergies as of 05/15/2021      Reactions   Other Anaphylaxis, Shortness Of Breath, Other (See Comments)   Raw fruit, A1 sauce- throat closes Other reaction(s): tongue nuumbness   Other Anaphylaxis, Other (See Comments)   *per pt* all fruits and  vegetables that grow on trees- including medication containing these products.    Shellfish Allergy Anaphylaxis      Medication List       Accurate as of May 15, 2021 11:05 AM. If you have any questions, ask your nurse or doctor.        STOP taking these medications   budesonide 32 MCG/ACT nasal spray Commonly known as: RHINOCORT AQUA Stopped by: Valentina Shaggy, MD   famotidine 20 MG tablet Commonly known as: PEPCID Stopped by: Valentina Shaggy, MD   nystatin-triamcinolone ointment Commonly known as: MYCOLOG Stopped by: Valentina Shaggy, MD   Super Thera Vite M Tabs Stopped by: Valentina Shaggy, MD   tiZANidine 2 MG tablet Commonly  known as: ZANAFLEX Stopped by: Valentina Shaggy, MD     TAKE these medications   albuterol 108 (90 Base) MCG/ACT inhaler Commonly known as: VENTOLIN HFA Inhale 1-2 puffs into the lungs every 4 (four) hours as needed for wheezing or shortness of breath.   beclomethasone 40 MCG/ACT inhaler Commonly known as: QVAR Inhale 1 puff into the lungs as needed.   EPINEPHrine 0.3 mg/0.3 mL Soaj injection Commonly known as: EPI-PEN Inject 0.3 mg into the muscle once as needed (allergic reaction).   ibuprofen 800 MG tablet Commonly known as: ADVIL Take 1 tablet (800 mg total) by mouth every 8 (eight) hours as needed.   loratadine 10 MG tablet Commonly known as: CLARITIN Take 10 mg by mouth daily as needed for allergies.   omeprazole 20 MG tablet Commonly known as: PRILOSEC OTC Take by mouth.   ondansetron 4 MG tablet Commonly known as: Zofran Take 1 tablet (4 mg total) by mouth every 8 (eight) hours as needed for nausea or vomiting.       Birth History: non-contributory  Developmental History: non-contributory  Past Surgical History: History reviewed. No pertinent surgical history.   Family History: Family History  Problem Relation Age of Onset  . Asthma Mother   . Allergic rhinitis Mother   . Eczema Mother   . Food Allergy Mother   . Urticaria Mother   . Diabetes Father   . Multiple sclerosis Father   . Diabetes Paternal Grandmother   . Heart attack Paternal Grandmother   . Angioedema Neg Hx   . Immunodeficiency Neg Hx      Social History: Kari lives at home with her family.  She lives in a house.  There is wood in the main living areas and carpeting in the bedroom.  There is electric heating and central cooling.  There are 4 dogs inside of the home.  There are no dust mite covers on the bedding.  There is no tobacco exposure.  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  She is not exposed to fumes, chemicals, or dust.  She  does [place] interstate or industrial area.  She does use a HEPA filter.   Review of Systems  Constitutional: Negative.  Negative for chills, fever, malaise/fatigue and weight loss.  HENT: Positive for congestion. Negative for ear discharge, ear pain and sinus pain.        Positive for postnasal drip.  Positive for itchy eyes.  Eyes: Negative for pain, discharge and redness.  Respiratory: Negative for cough, sputum production, shortness of breath and wheezing.        Positive for exercise-induced bronchospasm.  Cardiovascular: Negative.  Negative for chest pain and palpitations.  Gastrointestinal: Negative for  abdominal pain, constipation, diarrhea, heartburn, nausea and vomiting.  Skin: Negative.  Negative for itching and rash.  Neurological: Negative for dizziness and headaches.  Endo/Heme/Allergies: Positive for environmental allergies. Does not bruise/bleed easily.       Positive for food allergies.       Objective:   Blood pressure 114/76, pulse 74, temperature 98 F (36.7 C), temperature source Temporal, resp. rate 12, height 5\' 7"  (1.702 m), weight 177 lb 3.2 oz (80.4 kg), last menstrual period 04/25/2021, SpO2 99 %. Body mass index is 27.75 kg/m.   Physical Exam:   Physical Exam Constitutional:      Appearance: She is well-developed.  HENT:     Head: Normocephalic and atraumatic.     Right Ear: Tympanic membrane, ear canal and external ear normal.     Left Ear: Tympanic membrane, ear canal and external ear normal.     Nose: Mucosal edema and rhinorrhea present. No nasal deformity or septal deviation.     Right Turbinates: Enlarged, swollen and pale.     Left Turbinates: Enlarged, swollen and pale.     Right Sinus: No maxillary sinus tenderness or frontal sinus tenderness.     Left Sinus: No maxillary sinus tenderness or frontal sinus tenderness.     Comments: Possible right-sided nasal polyp.    Mouth/Throat:     Mouth: Mucous membranes are not pale and not  dry.     Pharynx: Uvula midline.  Eyes:     General:        Right eye: No discharge.        Left eye: No discharge.     Conjunctiva/sclera: Conjunctivae normal.     Right eye: Right conjunctiva is not injected. No chemosis.    Left eye: Left conjunctiva is not injected. No chemosis.    Pupils: Pupils are equal, round, and reactive to light.  Cardiovascular:     Rate and Rhythm: Normal rate and regular rhythm.     Heart sounds: Normal heart sounds.  Pulmonary:     Effort: Pulmonary effort is normal. No tachypnea, accessory muscle usage or respiratory distress.     Breath sounds: Normal breath sounds. No wheezing, rhonchi or rales.     Comments: Moving air well in all lung fields.  No increased work of breathing. Chest:     Chest wall: No tenderness.  Lymphadenopathy:     Cervical: No cervical adenopathy.  Skin:    Coloration: Skin is not pale.     Findings: No abrasion, erythema, petechiae or rash. Rash is not papular, urticarial or vesicular.  Neurological:     Mental Status: She is alert.  Psychiatric:        Behavior: Behavior is cooperative.      Diagnostic studies:    Spirometry: results normal (FEV1: 3.09/98%, FVC: 3.64/101%, FEV1/FVC: 85%).    Spirometry consistent with normal pattern.   Allergy Studies:     Airborne Adult Perc - 05/15/21 1009    Time Antigen Placed 1010    Allergen Manufacturer Lavella Hammock    Location Back    Number of Test 59    1. Control-Buffer 50% Glycerol Negative    2. Control-Histamine 1 mg/ml 2+    3. Albumin saline Negative    4. Bliss [place] Negative    5. [place] Negative    6. Johnson 3+    7. [place] Blue Negative    8. Meadow Fescue 3+    9. Perennial Rye 3+    10. Sweet Vernal  3+    11. Timothy Negative    12. Cocklebur Negative    13. Burweed Marshelder Negative    14. Ragweed, short Negative    15. Ragweed, Giant Negative    16. Plantain,  English 2+    17. Lamb's Quarters Negative    18. Sheep Sorrell Negative    19. Rough  Pigweed Negative    20. Marsh Elder, Rough Negative    21. Mugwort, Common Negative    22. Ash mix 3+    23. Birch mix 3+    24. Beech American 2+    25. Box, Elder 2+    26. Cedar, red 2+    27. Cottonwood, Eastern 3+    28. Elm mix Negative    29. Hickory 3+    30. Maple mix Negative    31. Oak, [place] mix 3+    32. Pecan Pollen 3+    33. Pine mix 2+    34. Sycamore Eastern 2+    35. Escalante, Black Pollen 2+    36. Alternaria alternata Negative    37. Cladosporium Herbarum Negative    38. Aspergillus mix Negative    39. Penicillium mix Negative    40. Bipolaris sorokiniana (Helminthosporium) Negative    41. Drechslera spicifera (Curvularia) Negative    42. Mucor plumbeus Negative    43. Fusarium moniliforme Negative    44. Aureobasidium pullulans (pullulara) Negative    45. Rhizopus oryzae Negative    46. Botrytis cinera Negative    47. Epicoccum nigrum Negative    48. Phoma betae Negative    49. Candida Albicans Negative    50. Trichophyton mentagrophytes Negative    51. Mite, D Farinae  5,000 AU/ml 2+    52. Mite, D Pteronyssinus  5,000 AU/ml 2+    53. Cat Hair 10,000 BAU/ml 2+    54.  Dog Epithelia 3+    55. Mixed Feathers Negative    56. Horse Epithelia 3+    57. Cockroach, German 2+    58. Mouse 3+    59. Tobacco Leaf Negative          Intradermal - 05/15/21 1101    Time Antigen Placed 1045    Allergen Manufacturer Greer    Location Arm    Number of Test 6    Intradermal Select    Control Negative    [place] 4+    Ragweed mix 4+    Mold 1 Negative    Mold 2 Negative    Mold 3 Negative    Mold 4 1+          Food Adult Perc - 05/15/21 1000    Time Antigen Placed 1010    Allergen Manufacturer Greer    Location Back    Number of allergen test 11    Control-Histamine 1 mg/ml 2+    1. Peanut --   +/-   5. Milk, cow Negative    6. Egg White, Chicken Negative    15. [place] nut Negative    25. Shrimp Negative    42. Tomato Negative    47.  Mushrooms Negative    51. Carrots 3+    59. Peach Negative    69. Ginger Negative           Allergy testing results were read and interpreted by myself, documented by clinical staff. And        Salvatore Marvel, MD [facility]

## 2021-05-18 NOTE — Progress Notes (Signed)
Subjective:   Patient ID: Kari Carrillo, female   DOB: 20 y.o.   MRN: [id number]   HPI 20 year old female presents the office for concerns of left ankle discomfort point to the medial aspect of the ankle.  She states that she got up from couch and she hit her left ankle against the hard part of a couch.  She had pain and swelling she was seen at emergency department and x-rays were taken which did not reveal any fracture.  She was  placed into a cam boot which she wore for couple weeks and presents today wearing a regular shoe.  States that she still has discomfort.  At the end of the exam she states that she has been having some right calf pain.  She recently got her COVID-vaccine.   Review of Systems  All other systems reviewed and are negative.  Past Medical History:  Diagnosis Date  . Allergy-induced asthma   . Asthma   . Balance problem   . Eczema   . GERD (gastroesophageal reflux disease)   . Hyperkalemia   . Near syncope   . Urinary frequency   . Vision disturbance     No past surgical history on file.   Current Outpatient Medications:  .  ibuprofen (ADVIL) 800 MG tablet, Take 1 tablet (800 mg total) by mouth every 8 (eight) hours as needed., Disp: 30 tablet, Rfl: 0 .  albuterol (PROVENTIL HFA;VENTOLIN HFA) 108 (90 Base) MCG/ACT inhaler, Inhale 1-2 puffs into the lungs every 4 (four) hours as needed for wheezing or shortness of breath., Disp: 1 Inhaler, Rfl: 0 .  azelastine (ASTELIN) 0.1 % nasal spray, Place 2 sprays into both nostrils 2 (two) times daily. Use in each nostril as directed, Disp: 90 mL, Rfl: 0 .  beclomethasone (QVAR) 40 MCG/ACT inhaler, Inhale 1 puff into the lungs as needed., Disp: , Rfl:  .  budesonide (RHINOCORT AQUA) 32 MCG/ACT nasal spray, Place 1 spray into both nostrils 2 (two) times daily as needed., Disp: 15 mL, Rfl: 0 .  EPINEPHrine 0.3 mg/0.3 mL IJ SOAJ injection, Inject 0.3 mg into the muscle once as needed (allergic reaction). , Disp: , Rfl:   .  fexofenadine (ALLEGRA) 180 MG tablet, Take 1 tablet (180 mg total) by mouth daily., Disp: 90 tablet, Rfl: 0 .  ibuprofen (ADVIL) 800 MG tablet, Take 1 tablet (800 mg total) by mouth every 8 (eight) hours as needed., Disp: 30 tablet, Rfl: 2 .  loratadine (CLARITIN) 10 MG tablet, Take 10 mg by mouth daily as needed for allergies., Disp: , Rfl:  .  omeprazole (PRILOSEC OTC) 20 MG tablet, Take by mouth., Disp: , Rfl:  .  ondansetron (ZOFRAN) 4 MG tablet, Take 1 tablet (4 mg total) by mouth every 8 (eight) hours as needed for nausea or vomiting., Disp: 20 tablet, Rfl: 0  Allergies  Allergen Reactions  . Other Anaphylaxis, Shortness Of Breath and Other (See Comments)    Raw fruit, A1 sauce- throat closes Other reaction(s): tongue nuumbness  . Other Anaphylaxis and Other (See Comments)    *per pt* all fruits and vegetables that grow on trees- including medication containing these products.   . Shellfish Allergy Anaphylaxis         Objective:  Physical Exam  General: AAO x3, NAD  Dermatological: Skin is warm, dry and supple bilateral. Nails x 10 are well manicured; remaining integument appears unremarkable at this time. There are no open sores, no preulcerative lesions, no rash or signs  of infection present.  Vascular: Dorsalis Pedis artery and Posterior Tibial artery pedal pulses are 2/4 bilateral with immedate capillary fill time.  Minimal discomfort of the right calf.  Calf is supple.  Neruologic: Grossly intact via light touch bilateral.   Musculoskeletal: Tenderness is localized along the medial malleolus.  No pain the proximal tib-fib, lateral ankle.  No pain in the foot.  Minimal edema present to medial aspect of the ankle.  No pain on the flexor tendons.  MMT 5/5.  Gait: Unassisted, Nonantalgic.       Assessment:   Bone contusion left ankle; right calf pain.     Plan:  -Treatment options discussed including all alternatives, risks, and complications -Etiology of  symptoms were discussed -Repeat x-rays of the ankle obtained and reviewed.  Edema present on the medial malleolus but no evidence of acute fracture.  Likely bone contusion. -Recommend remain in the cam boot for now.  Anti-inflammatories as needed and refilled ibuprofen.  Ice daily.  As she starts to feel better she can transition to regular shoe as tolerated -Order venous duplex right side to rule out DVT although suspicion is low.  This could be from compensation but she recently got her COVID-vaccine and she is worried about.  Trula Slade DPM

## 2021-05-23 ENCOUNTER — Telehealth: Payer: Self-pay | Admitting: Podiatry

## 2021-05-23 NOTE — Telephone Encounter (Signed)
Lattie Haw- can you please follow up on this? It's a venous duplexes to rule out DVT. And yes, the order is in [place]. It was put in when she was in the office.

## 2021-05-23 NOTE — Telephone Encounter (Signed)
Patient calling to request status of order for ultrasound. Patient is waiting to be scheduled for ultrasound of her leg, but there is no order in epic. Please advise.

## 2021-05-26 ENCOUNTER — Other Ambulatory Visit: Payer: Self-pay | Admitting: Podiatry

## 2021-05-26 DIAGNOSIS — M79604 Pain in right leg: Secondary | ICD-10-CM

## 2021-05-26 NOTE — Telephone Encounter (Signed)
Called and spoke with Lavella Lemons at [facility] vascular and vein and Lavella Lemons stated that the patient was not scheduled yet but will be working on it and call the patient. Lattie Haw

## 2021-05-26 NOTE — Telephone Encounter (Signed)
I did not realize it was put in for [facility]. I put in a new order for [facility] in [place].

## 2021-05-30 NOTE — Telephone Encounter (Signed)
Called patient and stated that [facility] was trying to call and set up a time and day to get the ultrasound done and that they would be calling to schedule. Lattie Haw

## 2021-06-05 ENCOUNTER — Ambulatory Visit: Payer: Managed Care, Other (non HMO) | Admitting: Podiatry

## 2021-06-06 ENCOUNTER — Ambulatory Visit (HOSPITAL_COMMUNITY)
Admission: RE | Admit: 2021-06-06 | Discharge: 2021-06-06 | Disposition: A | Discharge status: Home / Self Care | Payer: Managed Care, Other (non HMO) | Source: Ambulatory Visit | Attending: Podiatry | Admitting: Podiatry

## 2021-06-06 ENCOUNTER — Other Ambulatory Visit: Payer: Self-pay

## 2021-06-06 DIAGNOSIS — M79604 Pain in right leg: Secondary | ICD-10-CM | POA: Diagnosis not present

## 2021-06-27 ENCOUNTER — Other Ambulatory Visit: Payer: Self-pay

## 2021-06-27 ENCOUNTER — Ambulatory Visit (INDEPENDENT_AMBULATORY_CARE_PROVIDER_SITE_OTHER): Payer: Managed Care, Other (non HMO) | Admitting: Podiatry

## 2021-06-27 DIAGNOSIS — M79604 Pain in right leg: Secondary | ICD-10-CM | POA: Diagnosis not present

## 2021-06-27 DIAGNOSIS — M7752 Other enthesopathy of left foot: Secondary | ICD-10-CM | POA: Diagnosis not present

## 2021-06-27 DIAGNOSIS — I872 Venous insufficiency (chronic) (peripheral): Secondary | ICD-10-CM

## 2021-07-02 NOTE — Progress Notes (Signed)
Subjective: 20 year old female presents the office with her mom for concerns of continued discomfort, swelling to left ankle.  This started after she had an injury to the ankle where she hit her foot against the couch.  She was previously immobilized in a boot without significant improvement and she is continuing to have symptoms.  Also she has been having some swelling and pain to the right leg.  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  Denies any systemic complaints such as fevers, chills, nausea, vomiting. No acute changes since last appointment, and no other complaints at this time.   Objective: AAO x3, NAD DP/PT pulses palpable bilaterally, CRT less than 3 seconds There is continuation of tenderness along the medial aspect of the left ankle mostly along the course of the medial malleolus.  No significant discomfort along the flexor tendons.  Discomfort on the intermedial aspect of ankle.  No pain with ankle joint range of motion or crepitation.  There is tenderness to the right calf but seems to be more proximal just distal to the knee as well. No pain with calf compression, swelling, warmth, erythema  Assessment: Left ankle contusion, rule out osteochondral lesion/tendon dysfunction; right leg pain  Plan: -All treatment options discussed with the patient including all alternatives, risks, complications.  -Regards to left side given continuation of symptoms will order MRI of the left ankle. 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  Will refer to vein specialist for this.  Also sports medicine as I am not sure that the vein is actually causing the issue. -Patient encouraged to call the office with any questions, concerns, change in symptoms.   Trula Slade DPM

## 2021-07-04 ENCOUNTER — Telehealth: Payer: Self-pay | Admitting: *Deleted

## 2021-07-04 NOTE — Telephone Encounter (Signed)
Spoke with Karena Addison at [facility] and they are in the process of calling the patient today. Lattie Haw

## 2021-07-04 NOTE — Telephone Encounter (Signed)
-----   Message from Trula Slade, DPM sent at 07/02/2021  9:40 AM EDT ----- I ordered MRI of the left ankle.  Can you please follow-up on this?  Also I put a referral in for vascular.  I like to fax this over to [place] [facility] specialist.  Can you please take care of this?  Thank you.

## 2021-07-08 ENCOUNTER — Encounter: Payer: Self-pay | Admitting: Diagnostic Neuroimaging

## 2021-07-08 ENCOUNTER — Ambulatory Visit: Payer: Managed Care, Other (non HMO) | Admitting: Diagnostic Neuroimaging

## 2021-07-08 ENCOUNTER — Other Ambulatory Visit: Payer: Self-pay

## 2021-07-08 VITALS — BP 111/77 | HR 85 | Ht 67.0 in | Wt 180.0 lb

## 2021-07-08 DIAGNOSIS — M542 Cervicalgia: Secondary | ICD-10-CM | POA: Diagnosis not present

## 2021-07-08 DIAGNOSIS — G43009 Migraine without aura, not intractable, without status migrainosus: Secondary | ICD-10-CM

## 2021-07-08 MED ORDER — TOPIRAMATE 50 MG PO TABS: 50.0000 mg | ORAL_TABLET | Freq: Two times a day (BID) | ORAL | 12 refills | Status: DC

## 2021-07-08 NOTE — Patient Instructions (Signed)
-   trial of topiramate 50mg  at bedtime; after 1-2 weeks increase to 50mg  twice a day; drink plenty of water

## 2021-07-08 NOTE — Progress Notes (Signed)
[facility]  PATIENT: Kari Carrillo DOB: 2001/04/03  REFERRING CLINICIAN: Janie Morning, DO HISTORY FROM: patient  REASON FOR VISIT: follow up   HISTORICAL  CHIEF COMPLAINT:  Chief Complaint  Patient presents with   Follow-up    RM 7 with parents andrea and mark  Pt is well, intensity for headaches have increased, she is having them every other day that will last a few minutes. Nothing helps relieve headache that she has tried.     HISTORY OF PRESENT ILLNESS:    UPDATE (07/08/21, VRP): Since last visit, neck pain is improved with physical therapy.  Continues to have headaches.  Previously headaches were hours at a time, left or right-sided, throbbing pain.  No nausea or vomiting.  No sensitive light or sound.  Now patient having headaches that are more generalized, back of the head, back of the neck, lasting 2 to 3 minutes at a time.  She still feels a heartbeat throbbing sensation in her head.  UPDATE (08/21/20, VRP): Since last visit, doing well, except for occ pre-syncope events (2 per month). Trying to eat and drink more, but often skips breakfast and has restrictions due to food allergies, sensitivities and preference. Had  Some irregular menses, elevated prolactin level, and wanted to review MRI brain from prior (pituitary review). More stress at school.   PRIOR HPI: 20 year old female here for evaluation of lightheadedness and dizziness.  02/27/2020 patient was at home.  Her father had a syncopal event and had to go to the hospital.  Later that morning she was at the sink washing dishes when all of a sudden she felt lightheaded and dizzy.  Her mother was there and helped her sit down.  Patient briefly had tunnel vision but did not fully lose consciousness.  EMS called to patient's home and initial blood pressure was 64/48.  She was having nausea and headaches.  Blood pressure improved to 99/62 upon arrival to ER.  Since that time patient has had follow-up with  PCP and MRI of the brain.  Few nonspecific T2 hyperintensities were noted.  Patient's father has multiple sclerosis (also patient of mine) and therefore consultation was requested.  Patient has been having some intermittent balance issues urinary frequency issues.  Also having some intermittent visual changes.   REVIEW OF SYSTEMS: Full 14 system review of systems performed and negative with exception of: as per HPI.   ALLERGIES: Allergies  Allergen Reactions   Other Anaphylaxis, Shortness Of Breath and Other (See Comments)    Raw fruit, A1 sauce- throat closes Other reaction(s): tongue nuumbness   Other Anaphylaxis and Other (See Comments)    *per pt* all fruits and vegetables that grow on trees- including medication containing these products.    Shellfish Allergy Anaphylaxis    HOME MEDICATIONS: Outpatient Medications Prior to Visit  Medication Sig Dispense Refill   albuterol (PROVENTIL HFA;VENTOLIN HFA) 108 (90 Base) MCG/ACT inhaler Inhale 1-2 puffs into the lungs every 4 (four) hours as needed for wheezing or shortness of breath. 1 Inhaler 0   beclomethasone (QVAR) 40 MCG/ACT inhaler Inhale 1 puff into the lungs as needed.     budesonide (RHINOCORT AQUA) 32 MCG/ACT nasal spray Place 1 spray into both nostrils 2 (two) times daily as needed. 15 mL 0   EPINEPHrine 0.3 mg/0.3 mL IJ SOAJ injection Inject 0.3 mg into the muscle once as needed (allergic reaction).      fexofenadine (ALLEGRA) 180 MG tablet Take 1 tablet (180 mg total) by mouth  daily. 90 tablet 0   ibuprofen (ADVIL) 800 MG tablet Take 1 tablet (800 mg total) by mouth every 8 (eight) hours as needed. 30 tablet 2   ibuprofen (ADVIL) 800 MG tablet Take 1 tablet (800 mg total) by mouth every 8 (eight) hours as needed. 30 tablet 0   loratadine (CLARITIN) 10 MG tablet Take 10 mg by mouth daily as needed for allergies.     omeprazole (PRILOSEC OTC) 20 MG tablet Take by mouth.     ondansetron (ZOFRAN) 4 MG tablet Take 1 tablet (4 mg  total) by mouth every 8 (eight) hours as needed for nausea or vomiting. 20 tablet 0   azelastine (ASTELIN) 0.1 % nasal spray Place 2 sprays into both nostrils 2 (two) times daily. Use in each nostril as directed (Patient not taking: Reported on 07/08/2021) 90 mL 0   No facility-administered medications prior to visit.    PAST MEDICAL HISTORY: Past Medical History:  Diagnosis Date   Allergy-induced asthma    Asthma    Balance problem    Eczema    GERD (gastroesophageal reflux disease)    Hyperkalemia    Near syncope    Urinary frequency    Vision disturbance     PAST SURGICAL HISTORY: History reviewed. No pertinent surgical history.  FAMILY HISTORY: Family History  Problem Relation Age of Onset   Asthma Mother    Allergic rhinitis Mother    Eczema Mother    Food Allergy Mother    Urticaria Mother    Diabetes Father    Multiple sclerosis Father    Diabetes Paternal Grandmother    Heart attack Paternal Grandmother    Angioedema Neg Hx    Immunodeficiency Neg Hx     SOCIAL HISTORY: Social History   Socioeconomic History   Marital status: Single    Spouse name: Not on file   Number of children: Not on file   Years of education: Not on file   Highest education level: Some college, no degree  Occupational History   Not on file  Tobacco Use   Smoking status: Never   Smokeless tobacco: Never  Vaping Use   Vaping Use: Never used  Substance and Sexual Activity   Alcohol use: No   Drug use: No   Sexual activity: Never    Birth control/protection: None  Other Topics Concern   Not on file  Social History Narrative          Social Determinants of Health   Financial Resource Strain: Not on file  Food Insecurity: Not on file  Transportation Needs: Not on file  Physical Activity: Not on file  Stress: Not on file  Social Connections: Not on file  Intimate Partner Violence: Not on file     PHYSICAL EXAM  GENERAL EXAM/CONSTITUTIONAL: Vitals:  Vitals:    07/08/21 0856  BP: 111/77  Pulse: 85  Weight: 180 lb (81.6 kg)  Height: 5\' 7"  (1.702 m)   Body mass index is 28.19 kg/m. Wt Readings from Last 3 Encounters:  07/08/21 180 lb (81.6 kg)  05/15/21 177 lb 3.2 oz (80.4 kg)  05/02/21 176 lb 8 oz (80.1 kg)   Patient is in no distress; well developed, nourished and groomed; neck is supple  CARDIOVASCULAR: Examination of carotid arteries is normal; no carotid bruits Regular rate and rhythm, no murmurs Examination of peripheral vascular system by observation and palpation is normal  EYES: Ophthalmoscopic exam of optic discs and posterior segments is normal; no papilledema or  hemorrhages No results found.  MUSCULOSKELETAL: Gait, strength, tone, movements noted in Neurologic exam below  NEUROLOGIC: MENTAL STATUS:  No flowsheet data found. awake, alert, oriented to person, place and time recent and remote memory intact normal attention and concentration language fluent, comprehension intact, naming intact fund of knowledge appropriate  CRANIAL NERVE:  2nd - no papilledema on fundoscopic exam 2nd, 3rd, 4th, 6th - pupils equal and reactive to light, visual fields full to confrontation, extraocular muscles intact, no nystagmus 5th - facial sensation symmetric 7th - facial strength symmetric 8th - hearing intact 9th - palate elevates symmetrically, uvula midline 11th - shoulder shrug symmetric 12th - tongue protrusion midline  MOTOR:  normal bulk and tone, full strength in the BUE, BLE  SENSORY:  normal and symmetric to light touch, temperature, vibration  COORDINATION:  finger-nose-finger, fine finger movements normal  REFLEXES:  deep tendon reflexes present and symmetric  GAIT/STATION:  narrow based gait    DIAGNOSTIC DATA (LABS, IMAGING, TESTING) - I reviewed patient records, labs, notes, testing and imaging myself where available.  Lab Results  Component Value Date   WBC 10.9 (H) 05/02/2021   HGB 12.6  05/02/2021   HCT 39.4 05/02/2021   MCV 87.9 05/02/2021   PLT 320 05/02/2021      Component Value Date/Time   NA 139 05/02/2021 0956   NA 140 08/16/2020 1130   K 3.7 05/02/2021 0956   CL 103 05/02/2021 0956   CO2 28 05/02/2021 0956   GLUCOSE 71 05/02/2021 0956   BUN 7 05/02/2021 0956   BUN 8 08/16/2020 1130   CREATININE 0.58 05/02/2021 0956   CALCIUM 9.8 05/02/2021 0956   PROT 7.5 05/02/2021 0956   ALBUMIN 4.3 06/07/2018 1045   AST 14 05/02/2021 0956   ALT 12 05/02/2021 0956   ALKPHOS 71 06/07/2018 1045   BILITOT 0.4 05/02/2021 0956   GFRNONAA 132 08/16/2020 1130   GFRAA 152 08/16/2020 1130   Lab Results  Component Value Date   CHOL 155 05/02/2021   HDL 55 05/02/2021   LDLCALC 87 05/02/2021   TRIG 44 05/02/2021   CHOLHDL 2.8 05/02/2021   Lab Results  Component Value Date   HGBA1C 5.3 05/02/2021   No results found for: VITAMINB12 Lab Results  Component Value Date   TSH 0.578 08/06/2020     04/25/20 MRI brain [I reviewed images myself and agree with interpretation. Normal pituitary. -VRP]  1. Few scattered foci of T2/FLAIR hyperintensity involving the periventricular and subcortical white matter both cerebral hemispheres, nonspecific. Differential considerations are broad, and include sequelae of accelerated/hereditary chronic small-vessel ischemia, changes related to prior trauma, hypercoagulable state, vasculitis, sequelae of complicated migraines, prior infectious or inflammatory process, or possibly demyelination. No associated enhancement. 2. Otherwise unremarkable and normal brain MRI.   05/24/20 MRI cervical / thoracic spine - normal  04/17/21 MRI brain (without) demonstrating: - Few scattered punctate foci of bifrontal and left temporal gliosis, stable from 04/25/2020. - No acute findings.  No change from prior MRI.   04/17/21 MRI cervical spine - normal    ASSESSMENT AND PLAN  20 y.o. year old female here with:   Dx:  1. Neck pain   2.  Atypical migraine       PLAN:  HEADACHES (throbbing, neck pain, unilateral; hours; -N/V, - photo/phono) - headaches with some migraine features; will try topiramate 50mg  at bedtime; after 1-2 weeks increase to 50mg  twice a day; drink plenty of water  NECK PAIN (radiating to head and shoulders) -  continue PT exercises - reviewed sleep and stress management  Meds ordered this encounter  Medications   topiramate (TOPAMAX) 50 MG tablet    Sig: Take 1 tablet (50 mg total) by mouth 2 (two) times daily.    Dispense:  60 tablet    Refill:  12   Return in about 6 months (around 01/08/2022).    Penni Bombard, MD 4/00/8676, 1:95 AM Certified in Neurology, Neurophysiology and Neuroimaging  [facility] [address], [place], [place] [address] [phone]

## 2021-07-09 ENCOUNTER — Other Ambulatory Visit: Payer: Self-pay

## 2021-07-09 ENCOUNTER — Ambulatory Visit (INDEPENDENT_AMBULATORY_CARE_PROVIDER_SITE_OTHER): Payer: Managed Care, Other (non HMO) | Admitting: Family Medicine

## 2021-07-09 ENCOUNTER — Encounter: Payer: Self-pay | Admitting: Family Medicine

## 2021-07-09 ENCOUNTER — Telehealth: Payer: Self-pay | Admitting: *Deleted

## 2021-07-09 ENCOUNTER — Ambulatory Visit: Payer: Self-pay

## 2021-07-09 ENCOUNTER — Ambulatory Visit (INDEPENDENT_AMBULATORY_CARE_PROVIDER_SITE_OTHER): Payer: Managed Care, Other (non HMO)

## 2021-07-09 VITALS — BP 110/68 | HR 86 | Ht 67.0 in | Wt 179.0 lb

## 2021-07-09 DIAGNOSIS — M25561 Pain in right knee: Secondary | ICD-10-CM | POA: Diagnosis not present

## 2021-07-09 DIAGNOSIS — M25562 Pain in left knee: Secondary | ICD-10-CM

## 2021-07-09 DIAGNOSIS — M2550 Pain in unspecified joint: Secondary | ICD-10-CM

## 2021-07-09 LAB — CBC WITH DIFFERENTIAL/PLATELET
Basophils Absolute: 0.1 10*3/uL (ref 0.0–0.1)
Basophils Relative: 0.9 % (ref 0.0–3.0)
Eosinophils Absolute: 0.6 10*3/uL (ref 0.0–0.7)
Eosinophils Relative: 8.6 % — ABNORMAL HIGH (ref 0.0–5.0)
HCT: 33.9 % — ABNORMAL LOW (ref 36.0–46.0)
Hemoglobin: 11.6 g/dL — ABNORMAL LOW (ref 12.0–15.0)
Lymphocytes Relative: 42.1 % (ref 12.0–46.0)
Lymphs Abs: 3.1 10*3/uL (ref 0.7–4.0)
MCHC: 34.1 g/dL (ref 30.0–36.0)
MCV: 85.9 fl (ref 78.0–100.0)
Monocytes Absolute: 0.5 10*3/uL (ref 0.1–1.0)
Monocytes Relative: 6.9 % (ref 3.0–12.0)
Neutro Abs: 3.1 10*3/uL (ref 1.4–7.7)
Neutrophils Relative %: 41.5 % — ABNORMAL LOW (ref 43.0–77.0)
Platelets: 257 10*3/uL (ref 150.0–400.0)
RBC: 3.95 Mil/uL (ref 3.87–5.11)
RDW: 13.2 % (ref 11.5–15.5)
WBC: 7.4 10*3/uL (ref 4.0–10.5)

## 2021-07-09 LAB — COMPREHENSIVE METABOLIC PANEL
ALT: 12 U/L (ref 0–35)
AST: 20 U/L (ref 0–37)
Albumin: 4.4 g/dL (ref 3.5–5.2)
Alkaline Phosphatase: 69 U/L (ref 39–117)
BUN: 7 mg/dL (ref 6–23)
CO2: 24 mEq/L (ref 19–32)
Calcium: 9.3 mg/dL (ref 8.4–10.5)
Chloride: 104 mEq/L (ref 96–112)
Creatinine, Ser: 0.55 mg/dL (ref 0.40–1.20)
GFR: 130.36 mL/min (ref >60.00)
Glucose, Bld: 100 mg/dL — ABNORMAL HIGH (ref 70–99)
Potassium: 3.5 mEq/L (ref 3.5–5.1)
Sodium: 137 mEq/L (ref 135–145)
Total Bilirubin: 0.3 mg/dL (ref 0.2–1.2)
Total Protein: 7.3 g/dL (ref 6.0–8.3)

## 2021-07-09 LAB — SEDIMENTATION RATE: Sed Rate: 22 mm/hr — ABNORMAL HIGH (ref 0–20)

## 2021-07-09 LAB — CK: Total CK: 303 U/L — ABNORMAL HIGH (ref 7–177)

## 2021-07-09 NOTE — Telephone Encounter (Signed)
-----   Message from Trula Slade, DPM sent at 07/02/2021  9:40 AM EDT ----- I ordered MRI of the left ankle.  Can you please follow-up on this?  Also I put a referral in for vascular.  I like to fax this over to [place] [facility] specialist.  Can you please take care of this?  Thank you.

## 2021-07-09 NOTE — Patient Instructions (Addendum)
Thank you for coming in today.   Please get an Xray today before you leave   Please get labs today before you leave   Please use Voltaren gel (Generic Diclofenac Gel) up to 4x daily for pain as needed.  This is available over-the-counter as both the name brand Voltaren gel and the generic diclofenac gel.   I've referred you to Physical Therapy.  Let us know if you don't hear from them in one week.   Recheck in 1 month.

## 2021-07-09 NOTE — Telephone Encounter (Signed)
Patient has an appointment on 07-22-2021 at 7 am at [facility] imaging. Lattie Haw

## 2021-07-09 NOTE — Progress Notes (Signed)
Subjective:    CC: R leg pain  I, Kari Carrillo, LAT, ATC, am serving as scribe for Dr. Lynne Carrillo.  HPI: Pt is a 20 y/o female presenting w/ R leg/calf pain ongoing since May. Pt notes she got her 2nd COVID booster and then woke up the next day w/ pain. She locates her pain to .  She is being followed by Dr. Jacqualyn Carrillo at [facility] for L ankle pain and mentioned at her last visit that she was having R calf pain and a venous doppler US was ordered to r/u DVT. Today, pt locates pain to R proximal calf. Pt also c/o bilat knee pain, L>R. Pt locates pain to the anterior aspect of B knees, slightly distal to knees.  Leg/calf swelling: no Aggravating factors: nothing Treatments tried: IBU  Diagnostic testing: R LE vascular US- 06/06/21  Pertinent review of Systems: No fevers or chills  Relevant historical information: No personal or family history of rheumatologic disease.   Objective:    Vitals:   07/09/21 1431  BP: 110/68  Pulse: 86  SpO2: 96%   General: Well Developed, well nourished, and in no acute distress.   MSK: Right knee mild effusion normal motion with no crepitation. Tender palpation anterior knee. Stable ligamentous exam. Negative Murray's test. Reduced strength in knee extension with pain. Intact strength to flexion without pain.  Left knee mild effusion normal motion with no crepitation. Tender palpation anterior knee. Stable ligamentous exam. Negative McMurray's test. Reduced strength to knee extension with pain. Intact strength to flexion without pain.  Lab and Radiology Results  Diagnostic Limited MSK Ultrasound of: Right knee Quad tendon intact normal. Moderate joint effusion superior patellar space. Patellar tendon normal. Medial and lateral joint line normal-appearing Posterior medial knee no Baker's cyst. Posterior lateral knee small amount of hypoechoic fluid tracks near the lateral hamstring tendon at insertion onto fibula  head Impression: Moderate joint effusion.  Possible distal hamstring tendinitis  Diagnostic Limited MSK Ultrasound of: Left knee Quad tendon intact normal. Moderate joint effusion superior patellar space. Patellar tendon normal-appearing Medial and lateral joint lines normal-appearing Posterior knee normal. Impression: Moderate joint effusion  X-ray images bilateral knees obtained today personally and independently interpreted  Right knee: Normal-appearing x-ray.  No fractures.  No significant DJD.  Left knee: Calcification superior to patella and joint capsule.  No significant degenerative changes.  No acute fractures.  Await formal radiology review    Impression and Recommendations:    Assessment and Plan: 20 y.o. female with chronic bilateral knee pain with unclear etiology.  Ultrasound evaluation does show much more significant effusion than would be expected for a healthy 20 year old woman.  X-ray shows a calcification superior to the patella on the left knee.  This all occurred following COVID-vaccine which makes a rheumatologic explanation more likely.  Plan for rheumatologic work-up.  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  Plan for physical therapy trial as well as trial of Voltaren gel.  Recheck in 1 month..  If not improved and rheumatologic work-up negative would consider MRI.  PDMP not reviewed this encounter. Orders Placed This Encounter  Procedures   [place] LIMITED JOINT SPACE STRUCTURES LOW BILAT(NO LINKED CHARGES)    Standing Status:   Future    Number of Occurrences:   1    Standing Expiration Date:   01/09/2022    Order Specific Question:   Reason for Exam (SYMPTOM  OR DIAGNOSIS REQUIRED)    Answer:  bilateral knee pain    Order Specific Question:   Preferred imaging location?    Answer:   [facility]   DG Knee AP/LAT W/Sunrise Right    Standing Status:   Future    Number of Occurrences:   1    Standing  Expiration Date:   07/09/2022    Order Specific Question:   Reason for Exam (SYMPTOM  OR DIAGNOSIS REQUIRED)    Answer:   eval knee pain    Order Specific Question:   Is patient pregnant?    Answer:   No    Order Specific Question:   Preferred imaging location?    Answer:   [facility]   DG Knee AP/LAT W/Sunrise Left    Standing Status:   Future    Number of Occurrences:   1    Standing Expiration Date:   07/09/2022    Order Specific Question:   Reason for Exam (SYMPTOM  OR DIAGNOSIS REQUIRED)    Answer:   eval knee pain    Order Specific Question:   Is patient pregnant?    Answer:   No    Order Specific Question:   Preferred imaging location?    Answer:   [facility]   Comprehensive metabolic panel    Standing Status:   Future    Number of Occurrences:   1    Standing Expiration Date:   07/09/2022   Rheumatoid factor    Standing Status:   Future    Number of Occurrences:   1    Standing Expiration Date:   07/09/2022   Sedimentation rate    Standing Status:   Future    Number of Occurrences:   1    Standing Expiration Date:   07/09/2022   ANA    Standing Status:   Future    Number of Occurrences:   1    Standing Expiration Date:   7/82/4235   Cyclic citrul peptide antibody, IgG    Standing Status:   Future    Number of Occurrences:   1    Standing Expiration Date:   07/09/2022   CK    Standing Status:   Future    Number of Occurrences:   1    Standing Expiration Date:   07/09/2022   Complement, total    Standing Status:   Future    Number of Occurrences:   1    Standing Expiration Date:   07/09/2022   CBC w/Diff    Standing Status:   Future    Number of Occurrences:   1    Standing Expiration Date:   07/09/2022   Ambulatory referral to Physical Therapy    Referral Priority:   Routine    Referral Type:   Physical Medicine    Referral Reason:   Specialty Services Required    Requested Specialty:   Physical Therapy    Number of Visits Requested:   1   No  orders of the defined types were placed in this encounter.   Discussed warning signs or symptoms. Please see discharge instructions. Patient expresses understanding.   The above documentation has been reviewed and is accurate and complete Kari Carrillo, M.D.

## 2021-07-10 NOTE — Progress Notes (Signed)
A lab for muscle inflammation (CK) is mildly elevated The mild to minimal abnormalities on the CBC I would say are extremely minimal and not something to get worried about too much.  The only exception to that is the elevated eosinophils which could possibly represent an asthma but that is not a specific test for asthma. Sedimentation rate is a general marker for inflammation and that is minimally elevated and basically does not mean anything in this context.  Other labs are still pending

## 2021-07-11 LAB — CYCLIC CITRUL PEPTIDE ANTIBODY, IGG: Cyclic Citrullin Peptide Ab: <16 UNITS

## 2021-07-11 LAB — ANA: Anti Nuclear Antibody (ANA): NEGATIVE

## 2021-07-11 LAB — COMPLEMENT, TOTAL: Compl, Total (CH50): >60 U/mL — ABNORMAL HIGH (ref 31–60)

## 2021-07-11 LAB — RHEUMATOID FACTOR: Rheumatoid fact SerPl-aCnc: <14 IU/mL (ref <14)

## 2021-07-11 NOTE — Progress Notes (Signed)
Right knee x-ray looks normal to radiology

## 2021-07-11 NOTE — Progress Notes (Signed)
Complement level is elevated.  Other labs are negative.  The picture is not clear if this is a rheumatologic problem or not.  Recommend trial of physical therapy and if not better proceed to rheumatologic evaluation.

## 2021-07-11 NOTE — Progress Notes (Signed)
Left knee x-ray shows a calcified density's superior to the patella at the quadriceps the looks like you may have injured it there in the past.  We cannot figure out what is wrong an MRI would show this in more detail.

## 2021-07-16 ENCOUNTER — Telehealth: Payer: Self-pay | Admitting: *Deleted

## 2021-07-16 NOTE — Telephone Encounter (Signed)
-----   Message from Trula Slade, DPM sent at 07/02/2021  9:40 AM EDT ----- I ordered MRI of the left ankle.  Can you please follow-up on this?  Also I put a referral in for vascular.  I like to fax this over to [place] [facility] specialist.  Can you please take care of this?  Thank you.

## 2021-07-16 NOTE — Telephone Encounter (Signed)
Called and left a message for the patient stating I was calling to follow up to see if the patient got an appointment for [facility] vein specialist. Lattie Haw

## 2021-07-22 ENCOUNTER — Ambulatory Visit
Admission: RE | Admit: 2021-07-22 | Discharge: 2021-07-22 | Disposition: A | Discharge status: Home / Self Care | Payer: Managed Care, Other (non HMO) | Source: Ambulatory Visit | Attending: Podiatry | Admitting: Podiatry

## 2021-07-22 ENCOUNTER — Other Ambulatory Visit: Payer: Self-pay

## 2021-07-22 DIAGNOSIS — M7752 Other enthesopathy of left foot: Secondary | ICD-10-CM

## 2021-07-24 ENCOUNTER — Ambulatory Visit: Payer: Managed Care, Other (non HMO) | Admitting: Allergy & Immunology

## 2021-08-06 NOTE — Progress Notes (Signed)
I, Wendy Poet, LAT, ATC, am serving as scribe for [name].  Kari Carrillo is a 20 y.o. female who presents to [place] at [facility] today for f/u of B knee pain, L>R, and R calf pain.  She was last seen by Dr. Georgina Snell on 07/09/21 and was referred to PT at [facility].  She was also advised to use Voltaren gel.  Today, pt reports that her knees are feeling better and that she has completed approximately 4 PT sessions.  She locates her R knee pain to post knee/prox calf and L ant knee.  She states that her knee pain increases throughout the day but does not recall a specific aggravating factor.  Diagnostic testing: R and L knee XR- 07/09/21   Pertinent review of systems: No fevers or chills  Relevant historical information: GERD.  Asthma.   Exam:  BP 100/70 (BP Location: Right Arm, Patient Position: Sitting, Cuff Size: Normal)   Pulse 71   Ht '5\' 7"'$  (1.702 m)   Wt 177 lb (80.3 kg)   LMP 07/09/2021   SpO2 98%   BMI 27.72 kg/m  General: Well Developed, well nourished, and in no acute distress.   MSK: Knees bilaterally normal-appearing normal motion.  Normal gait.    Lab and Radiology Results EXAM: RIGHT KNEE 3 VIEWS; LEFT KNEE 3 VIEWS   COMPARISON:  None.   FINDINGS: RIGHT knee:   No acute fracture or dislocation. Joint spaces and alignment are maintained. No area of erosion or osseous destruction. No unexpected radiopaque foreign body. Soft tissues are unremarkable.   LEFT knee:   No acute fracture or dislocation. Well corticated ossific density superior to the patella in the region of the quadriceps most consistent with the sequela of remote prior trauma. Joint spaces and alignment are maintained. No area of erosion or osseous destruction. No unexpected radiopaque foreign body. Soft tissues are otherwise unremarkable.   IMPRESSION: No acute osseous abnormality. Sequela of remote prior trauma of the LEFT likely quadriceps muscle.      Electronically Signed   By: Valentino Saxon MD   On: 07/10/2021 17:44  EXAM: RIGHT KNEE 3 VIEWS; LEFT KNEE 3 VIEWS   COMPARISON:  None.   FINDINGS: RIGHT knee:   No acute fracture or dislocation. Joint spaces and alignment are maintained. No area of erosion or osseous destruction. No unexpected radiopaque foreign body. Soft tissues are unremarkable.   LEFT knee:   No acute fracture or dislocation. Well corticated ossific density superior to the patella in the region of the quadriceps most consistent with the sequela of remote prior trauma. Joint spaces and alignment are maintained. No area of erosion or osseous destruction. No unexpected radiopaque foreign body. Soft tissues are otherwise unremarkable.   IMPRESSION: No acute osseous abnormality. Sequela of remote prior trauma of the LEFT likely quadriceps muscle.     Electronically Signed   By: Valentino Saxon MD   On: 07/10/2021 17:44  I, [name], personally (independently) visualized and performed the interpretation of the images attached in this note.  Recent Results (from the past 2160 hour(s))  Complement, total     Status: Abnormal   Collection Time: 07/09/21  2:58 PM  Result Value Ref Range   Compl, Total (CH50) >60 (H) 31 - 60 U/mL    Comment: Verified by repeat analysis. .   Cyclic citrul peptide antibody, IgG     Status: None   Collection Time: 07/09/21  2:58 PM  Result  Value Ref Range   Cyclic Citrullin Peptide Ab <16 UNITS    Comment: Reference Range Negative:            <20 Weak Positive:       20-39 Moderate Positive:   40-59 Strong Positive:     >59 .   ANA     Status: None   Collection Time: 07/09/21  2:58 PM  Result Value Ref Range   Anti Nuclear Antibody (ANA) NEGATIVE NEGATIVE    Comment: ANA IFA is a first line screen for detecting the presence of up to approximately 150 autoantibodies in various autoimmune diseases. A negative ANA IFA result suggests an ANA-associated  autoimmune disease is not present at this time, but is not definitive. If there is high clinical suspicion for Sjogren's syndrome, testing for anti-SS-A/Ro antibody should be considered. Anti-Jo-1 antibody should be considered for clinically suspected inflammatory myopathies. . AC-0: Negative . International Consensus on ANA Patterns (https://www.hernandez-brewer.com/) . For additional information, please refer to http://education.[employer].com/faq/FAQ177 (This link is being provided for informational/ educational purposes only.) .   Rheumatoid factor     Status: None   Collection Time: 07/09/21  2:58 PM  Result Value Ref Range   Rhuematoid fact SerPl-aCnc <14 <14 IU/mL  CBC w/Diff     Status: Abnormal   Collection Time: 07/09/21  2:58 PM  Result Value Ref Range   WBC 7.4 4.0 - 10.5 K/uL   RBC 3.95 3.87 - 5.11 Mil/uL   Hemoglobin 11.6 (L) 12.0 - 15.0 g/dL   HCT 33.9 (L) 36.0 - 46.0 %   MCV 85.9 78.0 - 100.0 fl   MCHC 34.1 30.0 - 36.0 g/dL   RDW 13.2 11.5 - 15.5 %   Platelets 257.0 150.0 - 400.0 K/uL   Neutrophils Relative % 41.5 (L) 43.0 - 77.0 %   Lymphocytes Relative 42.1 12.0 - 46.0 %   Monocytes Relative 6.9 3.0 - 12.0 %   Eosinophils Relative 8.6 (H) 0.0 - 5.0 %   Basophils Relative 0.9 0.0 - 3.0 %   Neutro Abs 3.1 1.4 - 7.7 K/uL   Lymphs Abs 3.1 0.7 - 4.0 K/uL   Monocytes Absolute 0.5 0.1 - 1.0 K/uL   Eosinophils Absolute 0.6 0.0 - 0.7 K/uL   Basophils Absolute 0.1 0.0 - 0.1 K/uL  CK     Status: Abnormal   Collection Time: 07/09/21  2:58 PM  Result Value Ref Range   Total CK 303 (H) 7 - 177 U/L  Sedimentation rate     Status: Abnormal   Collection Time: 07/09/21  2:58 PM  Result Value Ref Range   Sed Rate 22 (H) 0 - 20 mm/hr  Comprehensive metabolic panel     Status: Abnormal   Collection Time: 07/09/21  2:58 PM  Result Value Ref Range   Sodium 137 135 - 145 mEq/L   Potassium 3.5 3.5 - 5.1 mEq/L   Chloride 104 96 - 112 mEq/L   CO2 24 19 - 32  mEq/L   Glucose, Bld 100 (H) 70 - 99 mg/dL   BUN 7 6 - 23 mg/dL   Creatinine, Ser 0.55 0.40 - 1.20 mg/dL   Total Bilirubin 0.3 0.2 - 1.2 mg/dL   Alkaline Phosphatase 69 39 - 117 U/L   AST 20 0 - 37 U/L   ALT 12 0 - 35 U/L   Total Protein 7.3 6.0 - 8.3 g/dL   Albumin 4.4 3.5 - 5.2 g/dL   GFR 130.36 >60.00 mL/min  Comment: Calculated using the CKD-EPI Creatinine Equation (2021)   Calcium 9.3 8.4 - 10.5 mg/dL       Assessment and Plan: 20 y.o. female with bilateral knee pain due to unclear etiology. Pain thought to be multifactorial.   She does have joint effusion on ultrasound on last exam along with her pain indicating that there probably is some inflammatory situation in her knee that is not fully understood.  She had a limited rheumatologic work-up at last visit which did show elevated complement level moderately elevated CK and mildly elevated sedimentation rate.  Based on the joint effusion, persistent pain, more diffuse arthralgias and myalgias and mildly elevated rheumatologic labs I think it is worthwhile for her to have a auscultation with rheumatology.  She may benefit from further more detailed lab work-up and assessment.  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  Plan to finish out PT and continue home exercise program.  Additionally some of the pain especially left knee may be related to the ossification seen in the left quad tendon/quad muscle.  If not improved with PT may benefit from extracorporeal shockwave therapy.  Consider this at follow-up in 6 weeks.  Also may need MRI as well to further characterize cause of pain.  Recheck 6 weeks.   PDMP not reviewed this encounter. Orders Placed This Encounter  Procedures   Ambulatory referral to Rheumatology    Referral Priority:   Routine    Referral Type:   Consultation     Referral Reason:   Specialty Services Required    Requested Specialty:   Rheumatology    Number of Visits Requested:   1   No orders of the defined types were placed in this encounter.    Discussed warning signs or symptoms. Please see discharge instructions. Patient expresses understanding.   The above documentation has been reviewed and is accurate and complete [name], M.D.   Total encounter time 30 minutes including face-to-face time with the patient and, reviewing past medical record, and charting on the date of service.

## 2021-08-07 ENCOUNTER — Ambulatory Visit (INDEPENDENT_AMBULATORY_CARE_PROVIDER_SITE_OTHER): Payer: Managed Care, Other (non HMO) | Admitting: Family Medicine

## 2021-08-07 ENCOUNTER — Other Ambulatory Visit: Payer: Self-pay

## 2021-08-07 ENCOUNTER — Encounter: Payer: Self-pay | Admitting: Family Medicine

## 2021-08-07 VITALS — BP 100/70 | HR 71 | Ht 67.0 in | Wt 177.0 lb

## 2021-08-07 DIAGNOSIS — M2550 Pain in unspecified joint: Secondary | ICD-10-CM | POA: Diagnosis not present

## 2021-08-07 DIAGNOSIS — M25562 Pain in left knee: Secondary | ICD-10-CM

## 2021-08-07 DIAGNOSIS — M25561 Pain in right knee: Secondary | ICD-10-CM | POA: Diagnosis not present

## 2021-08-07 NOTE — Patient Instructions (Addendum)
Thank you for coming in today.   Proceed to PT and home exercises.   If not well enough after PT ends next step is probably the high power ultrasound treatment to try to break up the calcification in the muscle/tendon in the left knee.   We can also do an MRI.   Recheck in about 6 weeks.   Let me know sooner if there is a problem or a concern.

## 2021-09-12 NOTE — Progress Notes (Signed)
   I, Wendy Poet, LAT, ATC, am serving as scribe for [name].  Kari Carrillo is a 20 y.o. female who presents to [place] at [facility] today for f/u of B knee, L>R, and R calf pain.  She was last seen by Dr. Georgina Snell on 08/07/21 and noted improvement in her knee pain. She had completed 4 PT sessions and was advised to con't PT and her HEP.  She was also referred to rheumatology.  Today, pt reports some improvement in her knee pain and they haven't been painful at all. Pt notes she has recently lost some weight.  She notes she has some right medial midfoot pain.  This is moderately painful worse with activity better with rest.  Diagnostic testing: R and L knee XR- 07/09/21  Pertinent review of systems: No fevers or chills  Relevant historical information: Otherwise healthy   Exam:  BP 118/76   Pulse 91   Ht 5\' 7"  (1.702 m)   Wt 172 lb 9.6 oz (78.3 kg)   SpO2 97%   BMI 27.03 kg/m  General: Well Developed, well nourished, and in no acute distress.   MSK: Right foot normal-appearing Mildly tender palpation right midfoot just distal to the navicular prominence.  Normal foot and ankle motion.  No pain with resisted foot motion. Strength is intact. Stable ligamentous exam.    Lab and Radiology Results  Diagnostic Limited MSK Ultrasound of: Right foot midfoot Normal-appearing posterior tibialis tendon and tibialis anterior tendon insertion midfoot. Normal bony structures. 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      Assessment and Plan: 20 y.o. female with right midfoot pain.  Thought to be possible tibialis anterior tendinopathy.  Plan to treat with scaphoid pad and home exercise program taught in clinic today by ATC.  Knee pain significant improvement with oral NSAIDs and home exercise program.  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  Patient has further diagnostic testing  pending in the future. Fortunately she is doing quite well with simple naproxen.  Watchful waiting otherwise recheck back with me as needed.   PDMP not reviewed this encounter. Orders Placed This Encounter  Procedures   [place] LIMITED JOINT SPACE STRUCTURES LOW RIGHT(NO LINKED CHARGES)    Order Specific Question:   Reason for Exam (SYMPTOM  OR DIAGNOSIS REQUIRED)    Answer:   eval right foot    Order Specific Question:   Preferred imaging location?    Answer:   [facility]   No orders of the defined types were placed in this encounter.    Discussed warning signs or symptoms. Please see discharge instructions. Patient expresses understanding.   The above documentation has been reviewed and is accurate and complete [name], M.D.

## 2021-09-15 ENCOUNTER — Other Ambulatory Visit: Payer: Self-pay

## 2021-09-15 ENCOUNTER — Ambulatory Visit: Payer: Self-pay

## 2021-09-15 ENCOUNTER — Ambulatory Visit (INDEPENDENT_AMBULATORY_CARE_PROVIDER_SITE_OTHER): Payer: Managed Care, Other (non HMO) | Admitting: Family Medicine

## 2021-09-15 VITALS — BP 118/76 | HR 91 | Ht 67.0 in | Wt 172.6 lb

## 2021-09-15 DIAGNOSIS — M79671 Pain in right foot: Secondary | ICD-10-CM

## 2021-09-15 DIAGNOSIS — M25561 Pain in right knee: Secondary | ICD-10-CM | POA: Diagnosis not present

## 2021-09-15 DIAGNOSIS — M25562 Pain in left knee: Secondary | ICD-10-CM

## 2021-09-15 NOTE — Patient Instructions (Addendum)
Thank you for coming in today.   Please complete the exercises that the athletic trainer went over with you:  View at [email or web address] using code: [email or web address]  Try wearing the small scaphoid pad in your shoe.  Glad you are doing better!  Recheck back as needed.

## 2021-09-22 ENCOUNTER — Other Ambulatory Visit: Payer: Self-pay

## 2021-09-22 ENCOUNTER — Ambulatory Visit (INDEPENDENT_AMBULATORY_CARE_PROVIDER_SITE_OTHER): Payer: Managed Care, Other (non HMO) | Admitting: Allergy & Immunology

## 2021-09-22 ENCOUNTER — Encounter: Payer: Self-pay | Admitting: Allergy & Immunology

## 2021-09-22 VITALS — BP 106/66 | HR 76 | Temp 98.1°F | Resp 16

## 2021-09-22 DIAGNOSIS — J302 Other seasonal allergic rhinitis: Secondary | ICD-10-CM | POA: Diagnosis not present

## 2021-09-22 DIAGNOSIS — J4520 Mild intermittent asthma, uncomplicated: Secondary | ICD-10-CM | POA: Insufficient documentation

## 2021-09-22 DIAGNOSIS — J3089 Other allergic rhinitis: Secondary | ICD-10-CM

## 2021-09-22 DIAGNOSIS — H60311 Diffuse otitis externa, right ear: Secondary | ICD-10-CM | POA: Diagnosis not present

## 2021-09-22 DIAGNOSIS — T781XXD Other adverse food reactions, not elsewhere classified, subsequent encounter: Secondary | ICD-10-CM

## 2021-09-22 MED ORDER — ALBUTEROL SULFATE HFA 108 (90 BASE) MCG/ACT IN AERS: 1.0000 | INHALATION_SPRAY | RESPIRATORY_TRACT | 1 refills | Status: DC | PRN

## 2021-09-22 MED ORDER — CIPROFLOXACIN-DEXAMETHASONE 0.3-0.1 % OT SUSP: 4.0000 [drp] | Freq: Two times a day (BID) | OTIC | 0 refills | Status: AC

## 2021-09-22 NOTE — Progress Notes (Signed)
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 Mild intermittent asthma, uncomplicated - Lung testing looked great. - We are not going to make any changes today. - Daily controller medication(s): NOTHING - Prior to physical activity: albuterol 2 puffs 10-15 minutes before physical activity. - Rescue medications: albuterol 4 puffs every 4-6 hours as needed - Changes during respiratory infections or worsening symptoms: Add on Qvar 37mcg to 2 puffs twice daily for ONE TO TWO WEEKS. - Asthma control goals:  * Full participation in all desired activities (may need albuterol before activity) * Albuterol use two time or less a week on average (not counting use with activity) * Cough interfering with sleep two time or less a month * Oral steroids no more than once a year * No hospitalizations  2. Seasonal and perennial allergic rhinitis (mouse, horse, grasses, ragweed, weeds, trees, indoor molds, dust mites, cat, dog and cockroach)  - Continue taking: Rhinocort one spray per nostril twice daily, Allegra (fexofenadine) 180mg  tablet 1-2 times daily and Astelin (azelastine) 2 sprays per nostril 1-2 times daily as needed - You can use an extra dose of the antihistamine, if needed, for breakthrough symptoms.  - Consider nasal saline rinses 1-2 times daily to remove allergens from the nasal cavities as well as help with mucous clearance (this is especially helpful to do before the nasal sprays are given) - Consider allergy shots as a means of long-term control.  3. Pollen-food allergy syndrome - Continue avoiding all  of your triggering foods. - Kari Carrillo is up to date.   4. Otiis externa - Start ciprodex one drop per ear 3-4 times daily for one week.   5. Follow up in 6 months or sooner if needed.   Subjective:   Kari Carrillo is a 20 y.o. 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  Kari is a 20 y.o. female presenting for a follow up visit.  She was last seen in May 2022 as a new patient.  At that time, her lung testing looked great.  We continue with albuterol as needed and Qvar added during flares.  She had testing that was positive to mouse, horse, grasses, ragweed, trees, weeds, indoor and outdoor molds, dust mite, cat, dog, dog, and cockroach.  We stopped her Claritin and started Rhinocort, Allegra, and Astelin.  For her pollen food syndrome, her testing was slightly reactive to peanuts and very reactive to carrots.  We did start her on Auvi-Q as needed.  Since the last visit, she has done well. She is here with her father today.   Asthma/Respiratory Symptom History: Asthma has been well controlled. She has not  needed her rescue inhaler. Kari's asthma has been well controlled. She has not required rescue medication, experienced nocturnal awakenings due to lower respiratory symptoms, nor have activities of daily living been limited. She has required no Emergency Department or Urgent Care visits for her asthma. She has required zero courses of systemic steroids for asthma exacerbations since the  last visit. ACT score today is 25, indicating excellent asthma symptom control.  Has not needed her controller medication at all.   Allergic Rhinitis Symptom History: She has been doing well. She is using her nasal sprays every day. She reports that she feels some wetness in her right ear. There is no drainage. IT is more painful. She has tried using some water and peroxide for it.   Food Allergy Symptom History: She continues to avoid all of her triggering foods. She has not needed to use her rescue inhaler at all. She has not had any problems with her oral allergy syndrome at all.   Otherwise, there have been no changes to her past medical history, surgical history, family history, or social history.    Review of Systems  Constitutional: Negative.  Negative for chills, fever, malaise/fatigue and weight loss.  HENT: Negative.  Negative for congestion, ear discharge and ear pain.   Eyes:  Negative for pain, discharge and redness.  Respiratory:  Negative for cough, sputum production, shortness of breath and wheezing.   Cardiovascular: Negative.  Negative for chest pain and palpitations.  Gastrointestinal:  Negative for abdominal pain, constipation, diarrhea, heartburn, nausea and vomiting.  Skin: Negative.  Negative for itching and rash.  Neurological:  Negative for dizziness and headaches.  Endo/Heme/Allergies:  Negative for environmental allergies. Does not bruise/bleed easily.      Objective:   Blood pressure 106/66, pulse 76, temperature 98.1 F (36.7 C), temperature source Temporal, resp. rate 16, SpO2 99 %. There is no height or weight on file to calculate BMI.   Physical Exam:  Physical Exam Vitals reviewed.  Constitutional:      Appearance: She is well-developed.  HENT:     Head: Normocephalic and atraumatic.     Right Ear: Tympanic membrane, ear canal and external ear normal.     Left Ear: Tympanic membrane, ear canal and external ear normal.     Nose: No nasal  deformity, septal deviation, mucosal edema or rhinorrhea.     Right Turbinates: Not enlarged or swollen.     Left Turbinates: Not enlarged or swollen.     Right Sinus: No maxillary sinus tenderness or frontal sinus tenderness.     Left Sinus: No maxillary sinus tenderness or frontal sinus tenderness.     Comments: Right sided nasal polyp.    Mouth/Throat:     Mouth: Mucous membranes are not pale and not dry.     Pharynx: Uvula midline.  Eyes:     General: Lids are normal. No allergic shiner.       Right eye: No discharge.        Left eye: No discharge.     Conjunctiva/sclera: Conjunctivae normal.     Right eye: Right conjunctiva is not injected. No chemosis.    Left eye: Left conjunctiva is not injected. No chemosis.    Pupils: Pupils are equal, round, and reactive to light.  Cardiovascular:     Rate and Rhythm: Normal rate and regular rhythm.     Heart sounds: Normal heart sounds.  Pulmonary:     Effort: Pulmonary effort is normal. No tachypnea, accessory muscle  usage or respiratory distress.     Breath sounds: Normal breath sounds. No wheezing, rhonchi or rales.     Comments: Moving air well in all lung fields. No increased work of breathing noted.  Chest:     Chest wall: No tenderness.  Lymphadenopathy:     Cervical: No cervical adenopathy.  Skin:    Coloration: Skin is not pale.     Findings: No abrasion, erythema, petechiae or rash. Rash is not papular, urticarial or vesicular.  Neurological:     Mental Status: She is alert.  Psychiatric:        Behavior: Behavior is cooperative.     Diagnostic studies:   Spirometry: results normal (FEV1: 2.86/91%, FVC: 3.35/93%, FEV1/FVC: 85%).    Spirometry consistent with normal pattern.   Allergy Studies: none        Salvatore Marvel, MD  [facility]

## 2021-09-22 NOTE — Patient Instructions (Addendum)
1. Mild intermittent asthma, uncomplicated - Lung testing looked great. - We are not going to make any changes today. - Daily controller medication(s): NOTHING - Prior to physical activity: albuterol 2 puffs 10-15 minutes before physical activity. - Rescue medications: albuterol 4 puffs every 4-6 hours as needed - Changes during respiratory infections or worsening symptoms: Add on Qvar 74mcg to 2 puffs twice daily for ONE TO TWO WEEKS. - Asthma control goals:  * Full participation in all desired activities (may need albuterol before activity) * Albuterol use two time or less a week on average (not counting use with activity) * Cough interfering with sleep two time or less a month * Oral steroids no more than once a year * No hospitalizations  2. Seasonal and perennial allergic rhinitis (mouse, horse, grasses, ragweed, weeds, trees, indoor molds, dust mites, cat, dog and cockroach)  - Continue taking: Rhinocort one spray per nostril twice daily, Allegra (fexofenadine) 180mg  tablet 1-2 times daily and Astelin (azelastine) 2 sprays per nostril 1-2 times daily as needed - You can use an extra dose of the antihistamine, if needed, for breakthrough symptoms.  - Consider nasal saline rinses 1-2 times daily to remove allergens from the nasal cavities as well as help with mucous clearance (this is especially helpful to do before the nasal sprays are given) - Consider allergy shots as a means of long-term control.  3. Pollen-food allergy syndrome - Continue avoiding all of your triggering foods. - [name] is up to date.   4. Otiis externa - Start ciprodex one drop per ear 3-4 times daily for one week.   5. No follow-ups on file.    Please inform us of any Emergency Department visits, hospitalizations, or changes in symptoms. Call us before going to the ED for breathing or allergy symptoms since we might be able to fit you in for a sick visit. Feel free to contact us anytime with any questions,  problems, or concerns.  It was a pleasure to meet you and your family today!  Websites that have reliable patient information: 1. American Academy of Asthma, Allergy, and Immunology: www.aaaai.org 2. Food Allergy Research and Education (FARE): foodallergy.org 3. Mothers of Asthmatics: http://www.asthmacommunitynetwork.org 4. American College of Allergy, Asthma, and Immunology: www.acaai.org   COVID-19 Vaccine Information can be found at: [email or web address] For questions related to vaccine distribution or appointments, please email [email or web address] or call [phone].   We realize that you might be concerned about having an allergic reaction to the COVID19 vaccines. To help with that concern, WE ARE OFFERING THE COVID19 VACCINES IN OUR OFFICE! Ask the front desk for dates!     "Like" [place] on Facebook and Instagram for our latest updates!      A healthy democracy works best when [employer] participate! Make sure you are registered to vote! If you have moved or changed any of your contact information, you will need to get this updated before voting!  In some cases, you MAY be able to register to vote online: [email or web address]    1. Control-Buffer 50% Glycerol Negative   2. Control-Histamine 1 mg/ml 2+   3. Albumin saline Negative   4. [place] Negative   5. [place] Negative   6. [place] 3+   7. Grannis Blue Negative   8. Meadow Fescue 3+   9. Perennial Rye 3+   10. Sweet Vernal 3+   11. Timothy Negative   12. Cocklebur Negative   13. Burweed Marshelder Negative   14. Ragweed, short Negative  15. Ragweed, Giant Negative   16. Plantain,  English 2+   17. Lamb's Quarters Negative   18. Sheep Sorrell Negative   19. Rough Pigweed Negative   20. Marsh Elder, Rough Negative   21. Mugwort, Common Negative   22. Ash mix 3+   23. Birch mix 3+   24. Beech American 2+   25. Box, Elder 2+    26. Cedar, red 2+   27. Cottonwood, Eastern 3+   28. Elm mix Negative   29. Hickory 3+   30. Maple mix Negative   31. Oak, [place] mix 3+   32. Pecan Pollen 3+   33. Pine mix 2+   34. Sycamore Eastern 2+   35. Senath, Black Pollen 2+   36. Alternaria alternata Negative   37. Cladosporium Herbarum Negative   38. Aspergillus mix Negative   39. Penicillium mix Negative   40. Bipolaris sorokiniana (Helminthosporium) Negative   41. Drechslera spicifera (Curvularia) Negative   42. Mucor plumbeus Negative   43. Fusarium moniliforme Negative   44. Aureobasidium pullulans (pullulara) Negative   45. Rhizopus oryzae Negative   46. Botrytis cinera Negative   47. Epicoccum nigrum Negative   48. Phoma betae Negative   49. Candida Albicans Negative   50. Trichophyton mentagrophytes Negative   51. Mite, D Farinae  5,000 AU/ml 2+   52. Mite, D Pteronyssinus  5,000 AU/ml 2+   53. Cat Hair 10,000 BAU/ml 2+   54.  Dog Epithelia 3+   55. Mixed Feathers Negative   56. Horse Epithelia 3+   57. Cockroach, German 2+   58. Mouse 3+   59. Tobacco Leaf Negative     Control Negative   [place] 4+   Ragweed mix 4+   Mold 1 Negative   Mold 2 Negative   Mold 3 Negative   Mold 4 1+     1. Peanut --   +/-  5. Milk, cow Negative   6. Egg White, Chicken Negative   15. [place] nut Negative   25. Shrimp Negative   42. Tomato Negative   47. Mushrooms Negative   51. Carrots 3+   59. Peach Negative   69. Ginger Negative     Allergy Shots   Allergies are the result of a chain reaction that starts in the immune system. Your immune system controls how your body defends itself. For instance, if you have an allergy to pollen, your immune system identifies pollen as an invader or allergen. Your immune system overreacts by producing antibodies called Immunoglobulin E (IgE). These antibodies travel to cells that release chemicals, causing an allergic reaction.  The concept behind allergy immunotherapy,  whether it is received in the form of shots or tablets, is that the immune system can be desensitized to specific allergens that trigger allergy symptoms. Although it requires time and patience, the payback can be long-term relief.  How Do Allergy Shots Work?  Allergy shots work much like a vaccine. Your body responds to injected amounts of a particular allergen given in increasing doses, eventually developing a resistance and tolerance to it. Allergy shots can lead to decreased, minimal or no allergy symptoms.  There generally are two phases: build-up and maintenance. Build-up often ranges from three to six months and involves receiving injections with increasing amounts of the allergens. The shots are typically given once or twice a week, though more rapid build-up schedules are sometimes used.  The maintenance phase begins when the most effective dose is reached.  This dose is different for each person, depending on how allergic you are and your response to the build-up injections. Once the maintenance dose is reached, there are longer periods between injections, typically two to four weeks.  Occasionally doctors give cortisone-type shots that can temporarily reduce allergy symptoms. These types of shots are different and should not be confused with allergy immunotherapy shots.  Who Can Be Treated with Allergy Shots?  Allergy shots may be a good treatment approach for people with allergic rhinitis (hay fever), allergic asthma, conjunctivitis (eye allergy) or stinging insect allergy.   Before deciding to begin allergy shots, you should consider:   The length of allergy season and the severity of your symptoms  Whether medications and/or changes to your environment can control your symptoms  Your desire to avoid long-term medication use  Time: allergy immunotherapy requires a major time commitment  Cost: may vary depending on your insurance coverage  Allergy shots for children age 67 and  older are effective and often well tolerated. They might prevent the onset of new allergen sensitivities or the progression to asthma.  Allergy shots are not started on patients who are pregnant but can be continued on patients who become pregnant while receiving them. In some patients with other medical conditions or who take certain common medications, allergy shots may be of risk. It is important to mention other medications you talk to your allergist.   When Will I Feel Better?  Some may experience decreased allergy symptoms during the build-up phase. For others, it may take as long as 12 months on the maintenance dose. If there is no improvement after a year of maintenance, your allergist will discuss other treatment options with you.  If you aren't responding to allergy shots, it may be because there is not enough dose of the allergen in your vaccine or there are missing allergens that were not identified during your allergy testing. Other reasons could be that there are high levels of the allergen in your environment or major exposure to non-allergic triggers like tobacco smoke.  What Is the Length of Treatment?  Once the maintenance dose is reached, allergy shots are generally continued for three to five years. The decision to stop should be discussed with your allergist at that time. Some people may experience a permanent reduction of allergy symptoms. Others may relapse and a longer course of allergy shots can be considered.  What Are the Possible Reactions?  The two types of adverse reactions that can occur with allergy shots are local and systemic. Common local reactions include very mild redness and swelling at the injection site, which can happen immediately or several hours after. A systemic reaction, which is less common, affects the entire body or a particular body system. They are usually mild and typically respond quickly to medications. Signs include increased allergy symptoms  such as sneezing, a stuffy nose or hives.  Rarely, a serious systemic reaction called anaphylaxis can develop. Symptoms include swelling in the throat, wheezing, a feeling of tightness in the chest, nausea or dizziness. Most serious systemic reactions develop within 30 minutes of allergy shots. This is why it is strongly recommended you wait in your doctor's office for 30 minutes after your injections. Your allergist is trained to watch for reactions, and his or her staff is trained and equipped with the proper medications to identify and treat them.  Who Should Administer Allergy Shots?  The preferred location for receiving shots is your prescribing allergist's office. Injections  can sometimes be given at another facility where the physician and staff are trained to recognize and treat reactions, and have received instructions by your prescribing allergist.

## 2021-09-26 ENCOUNTER — Ambulatory Visit: Payer: Managed Care, Other (non HMO) | Admitting: Obstetrics and Gynecology

## 2021-09-26 ENCOUNTER — Encounter: Payer: Self-pay | Admitting: Obstetrics and Gynecology

## 2021-09-26 ENCOUNTER — Other Ambulatory Visit: Payer: Self-pay

## 2021-09-26 VITALS — BP 118/70

## 2021-09-26 DIAGNOSIS — N760 Acute vaginitis: Secondary | ICD-10-CM

## 2021-09-26 LAB — WET PREP FOR TRICH, YEAST, CLUE

## 2021-09-26 MED ORDER — BETAMETHASONE VALERATE 0.1 % EX OINT: TOPICAL_OINTMENT | CUTANEOUS | 0 refills | Status: DC

## 2021-09-26 NOTE — Progress Notes (Signed)
GYNECOLOGY  VISIT   HPI: 20 y.o.   Single Black or African American Not Hispanic or Latino  female   [id number] with Patient's last menstrual period was 09/21/2021.   here for  Vaginal/vulvar itch and irritation, symptoms started 5-6 days. She has a burning sensation. Some clumpy vaginal discharge.   GYNECOLOGIC HISTORY: Patient's last menstrual period was 09/21/2021. 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  Current Outpatient Medications  Medication Sig Dispense Refill   albuterol (VENTOLIN HFA) 108 (90 Base) MCG/ACT inhaler Inhale 1-2 puffs into the lungs every 4 (four) hours as needed for wheezing or shortness of breath. 1 each 1   beclomethasone (QVAR) 40 MCG/ACT inhaler Inhale 1 puff into the lungs as needed.     budesonide (RHINOCORT AQUA) 32 MCG/ACT nasal spray Place 1 spray into both nostrils 2 (two) times daily as needed. 15 mL 0   ciprofloxacin-dexamethasone (CIPRODEX) OTIC suspension Place 4 drops into both ears 2  (two) times daily for 7 days. 7.5 mL 0   EPINEPHrine 0.3 mg/0.3 mL IJ SOAJ injection Inject 0.3 mg into the muscle once as needed (allergic reaction).      fexofenadine (ALLEGRA) 180 MG tablet Take 1 tablet (180 mg total) by mouth daily. 90 tablet 0   ibuprofen (ADVIL) 800 MG tablet Take 1 tablet (800 mg total) by mouth every 8 (eight) hours as needed. 30 tablet 2   loratadine (CLARITIN) 10 MG tablet Take 10 mg by mouth daily as needed for allergies.     naproxen (NAPROSYN) 250 MG tablet Take by mouth 2 (two) times daily with a meal.     naproxen (NAPROSYN) 500 MG tablet Take by mouth.     omeprazole (PRILOSEC OTC) 20 MG tablet Take by mouth.     ondansetron (ZOFRAN) 4 MG tablet Take 1 tablet (4 mg total) by mouth every 8 (eight) hours as needed for nausea or vomiting. 20 tablet 0   topiramate (TOPAMAX) 50 MG tablet Take 1 tablet (50 mg total) by mouth 2 (two) times daily. 60 tablet 12   triamcinolone cream (KENALOG) 0.1 % Apply topically.     No current facility-administered medications for this visit.     ALLERGIES: Other, Other, Shellfish allergy,  Carrot [daucus carota], and Peanut (diagnostic)  Family History  Problem Relation Age of Onset   Asthma Mother    Allergic rhinitis Mother    Eczema Mother    Food Allergy Mother    Urticaria Mother    Diabetes Father    Multiple sclerosis Father    Diabetes Paternal Grandmother    Heart attack Paternal Grandmother    Angioedema Neg Hx    Immunodeficiency Neg Hx     Social History   Socioeconomic History   Marital status: Single    Spouse name: Not on file   Number of children: Not on file   Years of education: Not on file   Highest education level: Some college, no degree  Occupational History   Not on file  Tobacco Use   Smoking status: Never   Smokeless tobacco: Never  Vaping Use   Vaping Use: Never used  Substance and Sexual Activity   Alcohol use: No   Drug use: No   Sexual activity: Never    Birth  control/protection: None  Other Topics Concern   Not on file  Social History Narrative          Social Determinants of Health   Financial Resource Strain: Not on file  Food Insecurity: Not on file  Transportation Needs: Not on file  Physical Activity: Not on file  Stress: Not on file  Social Connections: Not on file  Intimate Partner Violence: Not on file    ROS  PHYSICAL EXAMINATION:    BP 118/70   LMP 09/21/2021     General appearance: alert, cooperative and appears stated age   Pelvic: External genitalia:  no lesions              Urethra:  normal appearing urethra with no masses, tenderness or lesions              Bartholins and Skenes: normal                 Vagina: normal appearing vagina opening, patient declines speculum exam. Swabs used to collect vaginal sample                Chaperone was present for exam.  1. Vulvovaginitis - WET PREP FOR TRICH, YEAST, CLUE: negative - betamethasone valerate ointment (VALISONE) 0.1 %; Use a pea sized amount topically BID for up to 2 weeks as needed  Dispense: 30 g; Refill: 0 - [facility] Advanced Candida Vaginitis (CV), TMA -Vulvar skin care reviewed, information given

## 2021-09-26 NOTE — Patient Instructions (Signed)

## 2021-09-30 LAB — SURESWAB® ADVANCED CANDIDA VAGINITIS (CV), TMA
CANDIDA SPECIES: NOT DETECTED
Candida glabrata: NOT DETECTED

## 2021-10-04 ENCOUNTER — Other Ambulatory Visit: Payer: Self-pay

## 2021-10-04 ENCOUNTER — Encounter (HOSPITAL_COMMUNITY): Payer: Self-pay

## 2021-10-04 ENCOUNTER — Emergency Department (HOSPITAL_COMMUNITY): Payer: Managed Care, Other (non HMO)

## 2021-10-04 ENCOUNTER — Emergency Department (HOSPITAL_COMMUNITY)
Admission: EM | Admit: 2021-10-04 | Discharge: 2021-10-04 | Disposition: A | Discharge status: Home / Self Care | Payer: Managed Care, Other (non HMO) | Attending: Emergency Medicine | Admitting: Emergency Medicine

## 2021-10-04 DIAGNOSIS — S060X0A Concussion without loss of consciousness, initial encounter: Secondary | ICD-10-CM | POA: Diagnosis not present

## 2021-10-04 DIAGNOSIS — J4520 Mild intermittent asthma, uncomplicated: Secondary | ICD-10-CM | POA: Diagnosis not present

## 2021-10-04 DIAGNOSIS — Z91010 Allergy to peanuts: Secondary | ICD-10-CM | POA: Diagnosis not present

## 2021-10-04 DIAGNOSIS — S0990XA Unspecified injury of head, initial encounter: Secondary | ICD-10-CM | POA: Diagnosis present

## 2021-10-04 DIAGNOSIS — Y92520 Airport as the place of occurrence of the external cause: Secondary | ICD-10-CM | POA: Insufficient documentation

## 2021-10-04 DIAGNOSIS — W228XXA Striking against or struck by other objects, initial encounter: Secondary | ICD-10-CM | POA: Insufficient documentation

## 2021-10-04 NOTE — ED Provider Notes (Signed)
[facility] DEPT Provider Note   CSN: [id number] Arrival date & time: 10/04/21  1139     History Chief Complaint  Patient presents with   Head Injury    Kari Carrillo is a 20 y.o. female present emergency department with a head injury.  Patient reports that she struck her head very hard on [place] of [place]  She reports that she has full days, no persistent headache, felt nauseous, had some sensitivity to light.  She is not on blood thinners.  She does have a history of perhaps a mild concussion years ago after a karate accident, but denies any other issues with brain bleeds or intracranial injury.  She said she used to suffer from very frequent migraines but has not had one in "a long time".  She is here with her mother.  HPI     Past Medical History:  Diagnosis Date   Allergy-induced asthma    Asthma    Balance problem    Eczema    GERD (gastroesophageal reflux disease)    Hyperkalemia    Near syncope    Urinary frequency    Vision disturbance     Patient Active Problem List   Diagnosis Date Noted   Mild intermittent asthma, uncomplicated 55/73/2202   Seasonal and perennial allergic rhinitis 09/22/2021   Allergic rhinitis 05/02/2021   Anxiety 05/02/2021   Asthma 05/02/2021   Eustachian tube disorder 05/02/2021   Pollen-food allergy syndrome, subsequent encounter 05/02/2021   Tinnitus of left ear 05/02/2021   Gastroesophageal reflux disease without esophagitis 08/05/2015   Irritable bowel syndrome with diarrhea 08/05/2015    History reviewed. No pertinent surgical history.   OB History     Gravida  0   Para  0   Term  0   Preterm  0   AB  0   Living  0      SAB  0   IAB  0   Ectopic  0   Multiple  0   Live Births  0           Family History  Problem Relation Age of Onset   Asthma Mother    Allergic rhinitis Mother    Eczema Mother    Food Allergy Mother     Urticaria Mother    Diabetes Father    Multiple sclerosis Father    Diabetes Paternal Grandmother    Heart attack Paternal Grandmother    Angioedema Neg Hx    Immunodeficiency Neg Hx     Social History   Tobacco Use   Smoking status: Never   Smokeless tobacco: Never  Vaping Use   Vaping Use: Never used  Substance Use Topics   Alcohol use: No   Drug use: No    Home Medications Prior to Admission medications   Medication Sig Start Date End Date Taking? Authorizing Provider  albuterol (VENTOLIN HFA) 108 (90 Base) MCG/ACT inhaler Inhale 1-2 puffs into the lungs every 4 (four) hours as needed for wheezing or shortness of breath. 09/22/21   Valentina Shaggy, MD  beclomethasone (QVAR) 40 MCG/ACT inhaler Inhale 1 puff into the lungs as needed.    [provider]  betamethasone valerate ointment (VALISONE) 0.1 % Use a pea sized amount topically BID for up to 2 weeks as needed 09/26/21   Salvadore Dom, MD  budesonide (RHINOCORT AQUA) 32 MCG/ACT nasal spray Place 1 spray into both nostrils  2 (two) times daily as needed. 05/15/21   Valentina Shaggy, MD  EPINEPHrine 0.3 mg/0.3 mL IJ SOAJ injection Inject 0.3 mg into the muscle once as needed (allergic reaction).     [provider]  fexofenadine (ALLEGRA) 180 MG tablet Take 1 tablet (180 mg total) by mouth daily. 05/15/21   Valentina Shaggy, MD  ibuprofen (ADVIL) 800 MG tablet Take 1 tablet (800 mg total) by mouth every 8 (eight) hours as needed. 05/02/21   Salvadore Dom, MD  loratadine (CLARITIN) 10 MG tablet Take 10 mg by mouth daily as needed for allergies.    [provider]  naproxen (NAPROSYN) 250 MG tablet Take by mouth 2 (two) times daily with a meal.    [provider]  naproxen (NAPROSYN) 500 MG tablet Take by mouth. 08/21/21   [provider]  omeprazole (PRILOSEC OTC) 20 MG tablet Take by mouth.    [provider]  ondansetron (ZOFRAN) 4 MG tablet Take 1  tablet (4 mg total) by mouth every 8 (eight) hours as needed for nausea or vomiting. 05/02/21   Salvadore Dom, MD  topiramate (TOPAMAX) 50 MG tablet Take 1 tablet (50 mg total) by mouth 2 (two) times daily. 07/08/21   Penumalli, Earlean Polka, MD  triamcinolone cream (KENALOG) 0.1 % Apply topically. 08/07/21   [provider]    Allergies    Other, Other, Shellfish allergy, Carrot [daucus carota], and Peanut (diagnostic)  Review of Systems   Review of Systems  Constitutional:  Negative for chills and fever.  Eyes:  Positive for photophobia. Negative for visual disturbance.  Respiratory:  Negative for cough and shortness of breath.   Cardiovascular:  Negative for chest pain and palpitations.  Gastrointestinal:  Positive for nausea. Negative for abdominal pain.  Skin:  Negative for color change and rash.  Neurological:  Positive for dizziness, light-headedness and headaches. Negative for syncope, facial asymmetry, speech difficulty, weakness and numbness.  All other systems reviewed and are negative.  Physical Exam Updated Vital Signs BP 111/69   Pulse 74   Temp 97.6 F (36.4 C) (Oral)   Resp 19   LMP 09/21/2021   SpO2 99%   Physical Exam Constitutional:      General: She is not in acute distress. HENT:     Head: Normocephalic and atraumatic.  Eyes:     Conjunctiva/sclera: Conjunctivae normal.     Pupils: Pupils are equal, round, and reactive to light.  Cardiovascular:     Rate and Rhythm: Normal rate and regular rhythm.  Pulmonary:     Effort: Pulmonary effort is normal. No respiratory distress.  Abdominal:     General: There is no distension.     Tenderness: There is no abdominal tenderness.  Skin:    General: Skin is warm and dry.  Neurological:     General: No focal deficit present.     Mental Status: She is alert and oriented to person, place, and time. Mental status is at baseline.     Sensory: No sensory deficit.     Motor: No weakness.      Coordination: Coordination normal.     Gait: Gait normal.  Psychiatric:        Mood and Affect: Mood normal.        Behavior: Behavior normal.    ED Results / Procedures / Treatments   Labs (all labs ordered are listed, but only abnormal results are displayed) Labs Reviewed - No data to display  EKG None  Radiology CT Head Wo Contrast  Result Date: 10/04/2021 CLINICAL DATA:  Head trauma 2 days ago, hematoma to top of scalp, worsening headache. Evaluate for intracranial hemorrhage. EXAM: CT HEAD WITHOUT CONTRAST TECHNIQUE: Contiguous axial images were obtained from the base of the skull through the vertex without intravenous contrast. COMPARISON:  Head CT dated 07/19/2018. FINDINGS: Brain: Ventricles are normal in size and configuration. There is no mass, hemorrhage, edema or other evidence of acute parenchymal abnormality. No extra-axial hemorrhage. Vascular: No hyperdense vessel or unexpected calcification. Skull: Normal. Negative for fracture or focal lesion. Sinuses/Orbits: No acute finding. Other: Scalp edema overlying the vertex of the skull, without discrete hematoma. No underlying skull fracture. IMPRESSION: 1. Scalp edema overlying the vertex of the skull. No underlying skull fracture. 2. No acute intracranial abnormality. No intracranial hemorrhage or edema. Electronically Signed   By: Franki Cabot M.D.   On: 10/04/2021 12:40    Procedures Procedures   Medications Ordered in ED Medications - No data to display  ED Course  I have reviewed the triage vital signs and the nursing notes.  Pertinent labs & imaging results that were available during my care of the patient were reviewed by me and considered in my medical decision making (see chart for details).  Suspect this is related to a concussion, mild to moderate symptoms.  She seems to be tolerating her symptoms well at home with over-the-counter as needed medications.  I explained to the patient and her mother that most  people make a recovery from concussion within 30 days, but there may be some prolonged symptoms.  They verbalized understanding.  They live in [place], plan to be returning relatively soon, will need to follow-up with a specialist there if she has persistent symptoms.  However we did have a longer conversation regarding a CT scan to rule out brain bleed, they would both prefer to proceed with the scan at this time.  I think this is reasonable given that she is reporting worsening headache.  If the scan is negative, I anticipate discharge home  Clinical Course as of 10/04/21 1440  Sat Oct 04, 2021  1317 Patient mother updated regarding results, suspect concussion.  They did correct that they do not live in [place] anymore but he moved out here.  Therefore I will give him sports medicine follow-up in the concussion clinic if she has persistent symptoms 2 to 3 weeks.  Otherwise okay for discharge [MT]    Clinical Course User Index [MT] Crestina Strike, Carola Rhine, MD      Final Clinical Impression(s) / ED Diagnoses Final diagnoses:  Injury of head, initial encounter  Concussion without loss of consciousness, initial encounter    Rx / DC Orders ED Discharge Orders     None        Langston Masker Carola Rhine, MD 10/04/21 1440

## 2021-10-04 NOTE — ED Triage Notes (Signed)
Pt reports hitting her head on the roof of a car on Thursday. Pt reports some dizziness and headaches since then.

## 2021-10-04 NOTE — ED Provider Notes (Signed)
Emergency Medicine Provider Triage Evaluation Note  Kari Carrillo , a 20 y.o. female  was evaluated in triage.  Pt complains of headache and "knot" on head after hitting her head on the roof of the car 3 days ago. She reports photophobia, but no blurry vision. No weakness. No headache, just pain where she hit her head.   Review of Systems  Positive: Knot on head, photophobia Negative: Weakness, slurred speech, blurry vision  Physical Exam  LMP 09/21/2021  Gen:   Awake, no distress   Resp:  Normal effort  MSK:   Moves extremities without difficulty  Other:  No step offs or deformities noted.   Medical Decision Making  Medically screening exam initiated at 11:51 AM.  Appropriate orders placed.  Kari Carrillo was informed that the remainder of the evaluation will be completed by another provider, this initial triage assessment does not replace that evaluation, and the importance of remaining in the ED until their evaluation is complete.     Sherrell Puller, PA-C 10/04/21 1154    Wyvonnia Dusky, MD 10/04/21 1440

## 2021-10-13 ENCOUNTER — Ambulatory Visit (INDEPENDENT_AMBULATORY_CARE_PROVIDER_SITE_OTHER): Payer: Managed Care, Other (non HMO) | Admitting: Family Medicine

## 2021-10-13 ENCOUNTER — Encounter: Payer: Self-pay | Admitting: Family Medicine

## 2021-10-13 ENCOUNTER — Other Ambulatory Visit: Payer: Self-pay

## 2021-10-13 VITALS — BP 102/52 | HR 87 | Ht 67.0 in | Wt 178.6 lb

## 2021-10-13 DIAGNOSIS — S060X0A Concussion without loss of consciousness, initial encounter: Secondary | ICD-10-CM

## 2021-10-13 DIAGNOSIS — G44319 Acute post-traumatic headache, not intractable: Secondary | ICD-10-CM

## 2021-10-13 MED ORDER — TOPIRAMATE 25 MG PO TABS: 25.0000 mg | ORAL_TABLET | Freq: Two times a day (BID) | ORAL | 1 refills | Status: DC

## 2021-10-13 NOTE — Patient Instructions (Addendum)
Good to see you today.  Follow-up: 2 weeks.  If you are all better in 2 weeks ok to cancel.   Try the topomax twice daily or just at night for now.   OK to advance activity as tolerated.   Let me know if this is not working for you.

## 2021-10-13 NOTE — Progress Notes (Signed)
Subjective:   I, Wendy Poet, LAT, ATC, am serving as scribe for [name].  Chief Complaint: Kari Carrillo,  is a 20 y.o. female who presents for initial evaluation of a head injury that occurred on 10/01/21 when she  struck her head very hard on the [place] of [place] the airport. Pt was seen at the [facility] ED on 10/04/21 c/o HA, dizziness, nausea, and photophobia. Today, pt reports that she accidentlally hit her head again the Saturday after she went to the ED.  She is now c/o HA at the top of her head.  She has been taking Advil.  Dx imaging: 10/04/21 Head CT  Injury date : 10/01/21 Visit #: 1  History of Present Illness:    Concussion Self-Reported Symptom Score Symptoms rated on a scale 1-6, in last 24 hours   Headache: 4    Nausea: 2  Dizziness: 0  Vomiting: 0  Balance Difficulty: 0   Trouble Falling Asleep: 3   Fatigue: 1  Sleep Less Than Usual: 0  Daytime Drowsiness: 0  Sleep More Than Usual: 0  Photophobia: 2  Phonophobia: 3  Irritability: 0  Sadness: 0  Numbness or Tingling: 0  Nervousness: 0  Feeling More Emotional: 0  Feeling Mentally Foggy: 0  Feeling Slowed Down: 0  Memory Problems: 0  Difficulty Concentrating: 0  Visual Problems: 0  Total # of Symptoms: 6/22 Total Symptom Score: 15/132  Neck Pain: Yes Tinnitus: No  Review of Systems: No fevers or chills    Review of History: Headache disorder.  Previously took Topamax which worked well but caused sedation.  Objective:    Physical Examination Vitals:   10/13/21 1438  BP: (!) 102/52  Pulse: 87  SpO2: 96%   MSK: Normal cervical motion Neuro: Alert and oriented normal coordination and gait Psych: Normal Speech thought process and affect.     Imaging:    EXAM: CT HEAD WITHOUT CONTRAST   TECHNIQUE: Contiguous axial images were obtained from the base of the skull through the vertex without intravenous contrast.   COMPARISON:  Head CT dated 07/19/2018.    FINDINGS: Brain: Ventricles are normal in size and configuration. There is no mass, hemorrhage, edema or other evidence of acute parenchymal abnormality. No extra-axial hemorrhage.   Vascular: No hyperdense vessel or unexpected calcification.   Skull: Normal. Negative for fracture or focal lesion.   Sinuses/Orbits: No acute finding.   Other: Scalp edema overlying the vertex of the skull, without discrete hematoma. No underlying skull fracture.   IMPRESSION: 1. Scalp edema overlying the vertex of the skull. No underlying skull fracture. 2. No acute intracranial abnormality. No intracranial hemorrhage or edema.     Electronically Signed   By: Franki Cabot M.D.   On: 10/04/2021 12:40   I, [name], personally (independently) visualized and performed the interpretation of the images attached in this note.   Assessment and Plan   20 y.o. female with concussion.  Fortunately symptoms are more mild at this time.  Most dominant symptom is headache.  She does have a history of a headache disorder that was pretty well manageable with topiramate.  However this caused sedation.  We will try restarting topiramate but mostly at bedtime.  We will use a lower dose of first 25 mg twice daily or just 25 mg at bedtime.  If this is not effective enough for her to intolerable we will try using extended release Trokendi which may be more tolerable.  She will let me know.  Additionally we could always try nortriptyline.  Otherwise symptoms are pretty well manageable.  Continue school activity and other athletic activities as tolerated.  Recheck in 2 weeks.    Action/Discussion: Reviewed diagnosis, management options, expected outcomes, and the reasons for scheduled and emergent follow-up. Questions were adequately answered. Patient expressed verbal understanding and agreement with the following plan.     Patient Education: Reviewed with patient the risks (i.e, a repeat concussion,  post-concussion syndrome, second-impact syndrome) of returning to play prior to complete resolution, and thoroughly reviewed the signs and symptoms of concussion.Reviewed need for complete resolution of all symptoms, with rest AND exertion, prior to return to play. Reviewed red flags for urgent medical evaluation: worsening symptoms, nausea/vomiting, intractable headache, musculoskeletal changes, focal neurological deficits. [facility] Concussion Care Plan, which clearly outlines the plans stated above, was given to patient.   Level of service: Total encounter time 30 minutes including face-to-face time with the patient and, reviewing past medical record, and charting on the date of service.        After Visit Summary printed out and provided to patient as appropriate.  The above documentation has been reviewed and is accurate and complete [name]

## 2021-10-27 ENCOUNTER — Other Ambulatory Visit: Payer: Self-pay

## 2021-10-27 ENCOUNTER — Ambulatory Visit (INDEPENDENT_AMBULATORY_CARE_PROVIDER_SITE_OTHER): Payer: Managed Care, Other (non HMO) | Admitting: Family Medicine

## 2021-10-27 VITALS — BP 118/76 | HR 95 | Ht 67.0 in | Wt 172.0 lb

## 2021-10-27 DIAGNOSIS — G4701 Insomnia due to medical condition: Secondary | ICD-10-CM

## 2021-10-27 DIAGNOSIS — G43709 Chronic migraine without aura, not intractable, without status migrainosus: Secondary | ICD-10-CM | POA: Diagnosis not present

## 2021-10-27 DIAGNOSIS — S060X0D Concussion without loss of consciousness, subsequent encounter: Secondary | ICD-10-CM

## 2021-10-27 MED ORDER — TRAZODONE HCL 50 MG PO TABS: 50.0000 mg | ORAL_TABLET | Freq: Every evening | ORAL | 1 refills | Status: DC | PRN

## 2021-10-27 MED ORDER — TROKENDI XR 50 MG PO CP24: 50.0000 mg | ORAL_CAPSULE | Freq: Every day | ORAL | 1 refills | Status: DC

## 2021-10-27 NOTE — Progress Notes (Signed)
Subjective:   I, Peterson Lombard, LAT, ATC acting as a scribe for [name], MD.  Chief Complaint: Kari Carrillo,  is a 20 y.o. female who presents for f/u concussion that occurred on 10/01/21 when she struck her head very hard on the [place] of [place] the airport. Pt was seen at the [facility] ED on 10/04/21. Pt was last seen by Dr. Georgina Snell on 10/13/21 and was advised to restart topiramate 25mg , mostly at bedtime, and cont activity as tolerated. Today, pt reports HA have continued and she's been having a pain in her eyes and difficulty sleeping.  Dx imaging: 10/04/21 Head CT  Injury date : 10/01/21 Visit #: 2  History of Present Illness:   Concussion Self-Reported Symptom Score Symptoms rated on a scale 1-6, in last 24 hours   Headache: 4    Nausea: 0  Dizziness: 0  Vomiting: 0  Balance Difficulty: 0   Trouble Falling Asleep: 4   Fatigue: 2  Sleep Less Than Usual: 4  Daytime Drowsiness: 2  Sleep More Than Usual: 0  Photophobia: 0  Phonophobia: 0  Irritability: 0  Sadness: 0  Numbness or Tingling: 0  Nervousness: 0  Feeling More Emotional: 0  Feeling Mentally Foggy: 0  Feeling Slowed Down: 0  Memory Problems: 0  Difficulty Concentrating: 0  Visual Problems: 0  Total # of Symptoms: 5/32 Total Symptom Score: 16/132  Previous Total # of Symptoms: 6/22 Previous Symptom Score: 15/132  Neck Pain: No Tinnitus: No  Review of Systems: No fevers or chills  Review of History: History of migraine  Objective:    Physical Examination Vitals:   10/27/21 1424  BP: 118/76  Pulse: 95  SpO2: 96%   MSK: Normal cervical motion Neuro: Alert and oriented normal coordination and gait Psych: Normal speech thought process and affect.    Assessment and Plan   20 y.o. female with concussion with headache and insomnia.  Overall improving.  Headache.  Migraine type.  Difficulty tolerating immediate release Topamax.  At low doses its not effective and at high doses  is too sedating.  Additionally trouble tolerating nortriptyline in the past.  We will switch to extended release Trokendi which should be more tolerable.  Recheck in 1 month.  Insomnia: This is also a big issue.  We will try trazodone.  Recheck in a month.      Action/Discussion: Reviewed diagnosis, management options, expected outcomes, and the reasons for scheduled and emergent follow-up. Questions were adequately answered. Patient expressed verbal understanding and agreement with the following plan.     Patient Education: Reviewed with patient the risks (i.e, a repeat concussion, post-concussion syndrome, second-impact syndrome) of returning to play prior to complete resolution, and thoroughly reviewed the signs and symptoms of concussion.Reviewed need for complete resolution of all symptoms, with rest AND exertion, prior to return to play. Reviewed red flags for urgent medical evaluation: worsening symptoms, nausea/vomiting, intractable headache, musculoskeletal changes, focal neurological deficits. [facility] Concussion Care Plan, which clearly outlines the plans stated above, was given to patient.   Level of service: Total encounter time 20 minutes including face-to-face time with the patient and, reviewing past medical record, and charting on the date of service.        After Visit Summary printed out and provided to patient as appropriate.  The above documentation has been reviewed and is accurate and complete [name]

## 2021-10-27 NOTE — Patient Instructions (Addendum)
Thank you for coming in today.   Try the Trokendi for your headaches  Take the Trazodone at bedtime to help with sleep  Recheck back in 1 month

## 2021-11-09 ENCOUNTER — Other Ambulatory Visit: Payer: Self-pay

## 2021-11-09 ENCOUNTER — Encounter (HOSPITAL_BASED_OUTPATIENT_CLINIC_OR_DEPARTMENT_OTHER): Payer: Self-pay

## 2021-11-09 ENCOUNTER — Emergency Department (HOSPITAL_BASED_OUTPATIENT_CLINIC_OR_DEPARTMENT_OTHER): Payer: Managed Care, Other (non HMO)

## 2021-11-09 ENCOUNTER — Emergency Department (HOSPITAL_BASED_OUTPATIENT_CLINIC_OR_DEPARTMENT_OTHER)
Admission: EM | Admit: 2021-11-09 | Discharge: 2021-11-09 | Disposition: A | Discharge status: Home / Self Care | Payer: Managed Care, Other (non HMO) | Attending: Emergency Medicine | Admitting: Emergency Medicine

## 2021-11-09 DIAGNOSIS — Z91010 Allergy to peanuts: Secondary | ICD-10-CM | POA: Insufficient documentation

## 2021-11-09 DIAGNOSIS — Z7952 Long term (current) use of systemic steroids: Secondary | ICD-10-CM | POA: Diagnosis not present

## 2021-11-09 DIAGNOSIS — J45909 Unspecified asthma, uncomplicated: Secondary | ICD-10-CM | POA: Insufficient documentation

## 2021-11-09 DIAGNOSIS — Z20822 Contact with and (suspected) exposure to covid-19: Secondary | ICD-10-CM | POA: Insufficient documentation

## 2021-11-09 DIAGNOSIS — R062 Wheezing: Secondary | ICD-10-CM | POA: Diagnosis present

## 2021-11-09 LAB — RESP PANEL BY RT-PCR (FLU A&B, COVID) ARPGX2
Influenza A by PCR: NEGATIVE
Influenza B by PCR: NEGATIVE
SARS Coronavirus 2 by RT PCR: NEGATIVE

## 2021-11-09 MED ORDER — IPRATROPIUM-ALBUTEROL 0.5-2.5 (3) MG/3ML IN SOLN: 3.0000 mL | Freq: Once | RESPIRATORY_TRACT | Status: AC

## 2021-11-09 MED ORDER — PREDNISONE 50 MG PO TABS
60.0000 mg | ORAL_TABLET | Freq: Once | ORAL | Status: AC
  Administered 2021-11-09: 60 mg via ORAL
  Filled 2021-11-09: qty 1

## 2021-11-09 MED ORDER — ALBUTEROL SULFATE (2.5 MG/3ML) 0.083% IN NEBU: 2.5000 mg | INHALATION_SOLUTION | Freq: Once | RESPIRATORY_TRACT | Status: AC

## 2021-11-09 MED ORDER — PREDNISONE 50 MG PO TABS: 50.0000 mg | ORAL_TABLET | Freq: Every day | ORAL | 0 refills | Status: AC

## 2021-11-09 MED ORDER — IPRATROPIUM-ALBUTEROL 0.5-2.5 (3) MG/3ML IN SOLN
RESPIRATORY_TRACT | Status: AC
  Administered 2021-11-09: 3 mL via RESPIRATORY_TRACT
  Filled 2021-11-09: qty 3

## 2021-11-09 MED ORDER — ALBUTEROL SULFATE (2.5 MG/3ML) 0.083% IN NEBU
INHALATION_SOLUTION | RESPIRATORY_TRACT | Status: AC
  Administered 2021-11-09: 2.5 mg via RESPIRATORY_TRACT
  Filled 2021-11-09: qty 3

## 2021-11-09 MED ORDER — AEROCHAMBER PLUS FLO-VU MEDIUM MISC
1.0000 | Freq: Once | Status: AC
  Administered 2021-11-09: 1
  Filled 2021-11-09: qty 1

## 2021-11-09 NOTE — ED Provider Notes (Signed)
[facility] EMERGENCY DEPT Provider Note   CSN: [id number] Arrival date & time: 11/09/21  1025     History Chief Complaint  Patient presents with   Wheezing    Kari Carrillo is a 20 y.o. female.  HPI   20 y/o female with a h/o asthma, eczema, gerd, hyperkalemia, who presents to the ED today for eval of wheezing. States she has had wheezing and a cough for about a week. She denies sob, cp. Denies fevers or other URI sxs. Her regular inhalers and nebs at home do not seem to be improving symptoms. She received a neb tx prior to my eval and states she has some improvement but feels like she needs another breathing treatment.   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 No pertinent surgical history.   OB History     Gravida  0   Para  0   Term  0   Preterm  0   AB  0   Living  0      SAB  0   IAB  0   Ectopic  0   Multiple  0   Live Births  0           Family History  Problem Relation Age of Onset   Asthma Mother    Allergic rhinitis Mother    Eczema Mother    Food Allergy Mother    Urticaria Mother    Diabetes Father    Multiple sclerosis Father    Diabetes Paternal Grandmother    Heart attack Paternal Grandmother    Angioedema Neg Hx     Immunodeficiency Neg Hx     Social History   Tobacco Use   Smoking status: Never   Smokeless tobacco: Never  Vaping Use   Vaping Use: Never used  Substance Use Topics   Alcohol use: No   Drug use: No    Home Medications Prior to Admission medications   Medication Sig Start Date End Date Taking? Authorizing Provider  albuterol (VENTOLIN HFA) 108 (90 Base) MCG/ACT inhaler Inhale 1-2 puffs into the lungs every 4 (four) hours as needed for wheezing or shortness of breath. 09/22/21   Valentina Shaggy, MD  beclomethasone (QVAR) 40 MCG/ACT inhaler Inhale 1 puff into the lungs as needed.    [provider]  betamethasone valerate ointment (VALISONE) 0.1 % Use a pea sized amount topically BID for up to 2 weeks as needed 09/26/21   Salvadore Dom, MD  budesonide (RHINOCORT AQUA) 32 MCG/ACT nasal spray Place 1 spray into both nostrils 2 (two) times daily as needed. 05/15/21   Valentina Shaggy, MD  EPINEPHrine 0.3 mg/0.3 mL IJ SOAJ injection Inject 0.3 mg into the muscle once as needed (allergic reaction).  [provider]  fexofenadine (ALLEGRA) 180 MG tablet Take 1 tablet (180 mg total) by mouth daily. 05/15/21   Valentina Shaggy, MD  ibuprofen (ADVIL) 800 MG tablet Take 1 tablet (800 mg total) by mouth every 8 (eight) hours as needed. 05/02/21   Salvadore Dom, MD  loratadine (CLARITIN) 10 MG tablet Take 10 mg by mouth daily as needed for allergies.    [provider]  naproxen (NAPROSYN) 250 MG tablet Take by mouth 2 (two) times daily with a meal.    [provider]  naproxen (NAPROSYN) 500 MG tablet Take by mouth. 08/21/21   [provider]  omeprazole (PRILOSEC OTC) 20 MG tablet Take by mouth.    [provider]  ondansetron (ZOFRAN) 4 MG tablet Take 1 tablet (4 mg total) by mouth every 8 (eight) hours as needed for nausea or vomiting. 05/02/21   Salvadore Dom, MD  Topiramate ER (TROKENDI XR) 50 MG CP24 Take 50  mg by mouth daily. 10/27/21   Gregor Hams, MD  traZODone (DESYREL) 50 MG tablet Take 1 tablet (50 mg total) by mouth at bedtime as needed for sleep. 10/27/21   Gregor Hams, MD  triamcinolone cream (KENALOG) 0.1 % Apply topically. 08/07/21   [provider]    Allergies    Other, Other, Shellfish allergy, Carrot [daucus carota], and Peanut (diagnostic)  Review of Systems   Review of Systems  Constitutional:  Negative for fever.  HENT:  Negative for ear pain and sore throat.   Eyes:  Negative for visual disturbance.  Respiratory:  Positive for cough and wheezing. Negative for shortness of breath.   Cardiovascular:  Negative for chest pain.  Gastrointestinal:  Negative for abdominal pain and vomiting.  Genitourinary:  Negative for flank pain.  Musculoskeletal:  Negative for myalgias.  Skin:  Negative for rash.  Neurological:  Negative for headaches.  All other systems reviewed and are negative.  Physical Exam Updated Vital Signs BP 116/71 (BP Location: Right Arm)   Pulse 83   Temp 98 F (36.7 C)   Resp 15   Ht 5\' 8"  (1.727 m)   Wt 75.8 kg   LMP 10/09/2021   SpO2 98%   BMI 25.39 kg/m   Physical Exam Vitals and nursing note reviewed.  Constitutional:      General: She is not in acute distress.    Appearance: She is well-developed.  HENT:     Head: Normocephalic and atraumatic.  Eyes:     Conjunctiva/sclera: Conjunctivae normal.  Cardiovascular:     Rate and Rhythm: Normal rate and regular rhythm.     Heart sounds: Normal heart sounds.  Pulmonary:     Effort: Pulmonary effort is normal. No respiratory distress.     Breath sounds: Normal breath sounds. No wheezing, rhonchi or rales.  Abdominal:     Palpations: Abdomen is soft.     Tenderness: There is no abdominal tenderness.  Musculoskeletal:        General: Normal range of motion.     Cervical back: Neck supple.  Skin:    General: Skin is warm and dry.  Neurological:     Mental Status: She is alert.     ED Results / Procedures / Treatments   Labs (all labs ordered are listed, but only abnormal results are displayed) Labs Reviewed  RESP PANEL BY RT-PCR (FLU A&B, COVID) [id number]    EKG None  Radiology DG Chest Portable 1 View  Result Date:  11/09/2021 CLINICAL DATA:  Wheezing, difficulty breathing EXAM: PORTABLE CHEST 1 VIEW COMPARISON:  12/31/2020 FINDINGS: The heart size and mediastinal contours are within normal limits. Both lungs are clear. The visualized skeletal structures are unremarkable. IMPRESSION: No active disease. Electronically Signed   By: Elmer Picker M.D.   On: 11/09/2021 12:29    Procedures Procedures   Medications Ordered in ED Medications  ipratropium-albuterol (DUONEB) 0.5-2.5 (3) MG/3ML nebulizer solution 3 mL (has no administration in time range)  AeroChamber Plus Flo-Vu Medium MISC 1 each (has no administration in time range)  predniSONE (DELTASONE) tablet 60 mg (has no administration in time range)  ipratropium-albuterol (DUONEB) 0.5-2.5 (3) MG/3ML nebulizer solution (has no administration in time range)  albuterol (PROVENTIL) (2.5 MG/3ML) 0.083% nebulizer solution 2.5 mg (2.5 mg Nebulization Given 11/09/21 1151)    ED Course  I have reviewed the triage vital signs and the nursing notes.  Pertinent labs & imaging results that were available during my care of the patient were reviewed by me and considered in my medical decision making (see chart for details).    MDM Rules/Calculators/A&P                          Patient monitored in ED with O2 saturations maintained >90, no current signs of respiratory distress. Lung exam clear after nebulizer treatment. Prednisone given in the ED and pt will bd dc with 5 day burst. Pt states they are breathing at baseline. Pt has been instructed to continue using prescribed medications and to speak with PCP about today's exacerbation.   Final Clinical Impression(s) / ED Diagnoses Final diagnoses:   Uncomplicated asthma, unspecified asthma severity, unspecified whether persistent    Rx / DC Orders ED Discharge Orders     None        Rodney Booze, PA-C 11/09/21 1254    Gareth Morgan, MD 11/10/21 0003

## 2021-11-09 NOTE — Discharge Instructions (Signed)
Take prednisone as directed.   Please follow up with your primary care provider within 5-7 days for re-evaluation of your symptoms. If you do not have a primary care provider, information for a healthcare clinic has been provided for you to make arrangements for follow up care. Please return to the emergency department for any new or worsening symptoms.  

## 2021-11-09 NOTE — ED Triage Notes (Signed)
Pt arrives ambulatory to ED with c/o wheezing states that she feels like she has a cold which has caused her to develop wheezing. RT at bedside, see assessment. Pt also reports productive yellow/green cough X1 week.

## 2021-11-10 ENCOUNTER — Telehealth (HOSPITAL_BASED_OUTPATIENT_CLINIC_OR_DEPARTMENT_OTHER): Payer: Self-pay | Admitting: Emergency Medicine

## 2021-11-17 ENCOUNTER — Other Ambulatory Visit (HOSPITAL_COMMUNITY): Payer: Self-pay

## 2021-11-17 MED ORDER — LAGEVRIO 200 MG PO CAPS
ORAL_CAPSULE | ORAL | 0 refills | Status: DC
  Filled 2021-11-17: qty 40, 5d supply, fill #0

## 2021-11-19 ENCOUNTER — Other Ambulatory Visit: Payer: Self-pay | Admitting: Family Medicine

## 2021-11-19 NOTE — Telephone Encounter (Signed)
Rx refill request approved per Dr. Corey's orders. 

## 2021-11-24 ENCOUNTER — Ambulatory Visit: Payer: Managed Care, Other (non HMO) | Admitting: Family Medicine

## 2021-11-24 NOTE — Progress Notes (Deleted)
Subjective:   I, Peterson Lombard, LAT, ATC acting as a scribe for [name], MD.  Chief Complaint: Kari Carrillo,  is a 20 y.o. female who presents for f/u concussion that occurred on 10/01/21 when she  struck her head very hard on the [place] of [place] the airport. Pt was seen at the [facility] ED on 10/04/21. Pt was last seen by Dr. Georgina Snell on 10/13/21 and was advised to restart  topiramate 25mg , but mostly at bedtime, or twice daily. Today, pt reports  Dx imaging: 10/04/21 Head CT  Injury date : 10/01/21 Visit #: 2  History of Present Illness:   Concussion Self-Reported Symptom Score Symptoms rated on a scale 1-6, in last 24 hours   Headache: ***    Nausea: ***  Dizziness: ***  Vomiting: ***  Balance Difficulty: ***   Trouble Falling Asleep: ***   Fatigue: ***  Sleep Less Than Usual: ***  Daytime Drowsiness: ***  Sleep More Than Usual: ***  Photophobia: ***  Phonophobia: ***  Irritability: ***  Sadness: ***  Numbness or Tingling: ***  Nervousness: ***  Feeling More Emotional: ***  Feeling Mentally Foggy: ***  Feeling Slowed Down: ***  Memory Problems: ***  Difficulty Concentrating: ***  Visual Problems: ***  Total # of Symptoms:  Total Symptom Score: ***  Previous Total # of Symptoms: 6/22 Previous Symptom Score: 15/132  Neck Pain: Yes/No Tinnitus: Yes/No  Review of Systems:  ***    Review of History: ***  Objective:    Physical Examination There were no vitals filed for this visit. MSK:  *** Neuro: *** Psych: ***     Imaging:  ***  Assessment and Plan   20 y.o. female with ***    ***    Action/Discussion: Reviewed diagnosis, management options, expected outcomes, and the reasons for scheduled and emergent follow-up. Questions were adequately answered. Patient expressed verbal understanding and agreement with the following plan.     Patient Education: Reviewed with patient the risks (i.e, a repeat concussion,  post-concussion syndrome, second-impact syndrome) of returning to play prior to complete resolution, and thoroughly reviewed the signs and symptoms of concussion.Reviewed need for complete resolution of all symptoms, with rest AND exertion, prior to return to play. Reviewed red flags for urgent medical evaluation: worsening symptoms, nausea/vomiting, intractable headache, musculoskeletal changes, focal neurological deficits. [facility] Concussion Care Plan, which clearly outlines the plans stated above, was given to patient.   Level of service: ***     After Visit Summary printed out and provided to patient as appropriate.  The above documentation has been reviewed and is accurate and complete Kari Carrillo

## 2021-12-09 NOTE — Progress Notes (Signed)
Subjective:   I, Peterson Lombard, LAT, ATC acting as a scribe for [name], MD.  Chief Complaint: Kari Carrillo,  is a 20 y.o. female who presents for f/u concussion that occurred on 10/01/21 when she struck her head very hard on the roof of her [place] while at the airport. Pt was seen at the [facility] ED on 10/04/21 for this injury. Pt was last seen by Dr. Georgina Snell on 10/27/21 and was switched to Trokendi and was prescribed trazodone to treat the insomnia. Today, pt reports the Trokendi has been helpful. Pt report no longer suffering from HA and no longer has any symptoms.  Patient notes that she did have a headache prior to her concussion.  They are much better controlled now with Trokendi.  Dx imaging: 10/04/21 Head CT  Injury date : 10/01/21 Visit #: 3  History of Present Illness:   Concussion Self-Reported Symptom Score Symptoms rated on a scale 1-6, in last 24 hours   Headache: 0    Nausea: 0  Dizziness: 0  Vomiting: 0  Balance Difficulty: 0   Trouble Falling Asleep: 0   Fatigue: 0  Sleep Less Than Usual: 0  Daytime Drowsiness: 0  Sleep More Than Usual: 0  Photophobia: 0  Phonophobia: 0  Irritability: 0  Sadness: 0  Numbness or Tingling: 0  Nervousness: 0  Feeling More Emotional: 0  Feeling Mentally Foggy: 0  Feeling Slowed Down: 0  Memory Problems: 0  Difficulty Concentrating: 0  Visual Problems: 0  Total # of Symptoms: 0/32 Total Symptom Score: 0/132  Previous Total # of Symptoms: 5/32 Previous Symptom Score: 16/132  Neck Pain: No Tinnitus: No  Review of Systems: No fevers or chills    Review of History: Migraine headaches  Objective:    Physical Examination Vitals:   12/10/21 1410  BP: 118/72  Pulse: 100  SpO2: 96%   MSK: Normal cervical motion Neuro: Alert and oriented normal coordination balance and gait Psych: Normal speech thought process and affect.    Assessment and Plan   20 y.o. female with  concussion now completely  asymptomatic.   Headaches: Thought to be postconcussion or migraine related.  Headaches are completely controlled with Trokendi.  Can continue Trokendi indefinitely or as long as needed.  PCP can take over if needed.  Happy to continue prescribing in the future.   Recheck as needed    Action/Discussion: Reviewed diagnosis, management options, expected outcomes, and the reasons for scheduled and emergent follow-up. Questions were adequately answered. Patient expressed verbal understanding and agreement with the following plan.      Level of service: Total encounter time 20 minutes including face-to-face time with the patient and, reviewing past medical record, and charting on the date of service.        After Visit Summary printed out and provided to patient as appropriate.  The above documentation has been reviewed and is accurate and complete [name]

## 2021-12-10 ENCOUNTER — Ambulatory Visit (INDEPENDENT_AMBULATORY_CARE_PROVIDER_SITE_OTHER): Payer: Managed Care, Other (non HMO) | Admitting: Family Medicine

## 2021-12-10 ENCOUNTER — Other Ambulatory Visit: Payer: Self-pay

## 2021-12-10 VITALS — BP 118/72 | HR 100 | Ht 68.0 in | Wt 180.0 lb

## 2021-12-10 DIAGNOSIS — G43709 Chronic migraine without aura, not intractable, without status migrainosus: Secondary | ICD-10-CM | POA: Diagnosis not present

## 2021-12-10 DIAGNOSIS — S060X0D Concussion without loss of consciousness, subsequent encounter: Secondary | ICD-10-CM

## 2021-12-10 MED ORDER — TROKENDI XR 50 MG PO CP24: 50.0000 mg | ORAL_CAPSULE | Freq: Every day | ORAL | 3 refills | Status: DC

## 2021-12-10 NOTE — Patient Instructions (Signed)
Thank you for coming in today.   Glad you are doing well!  Happy holidays!!  Recheck back as needed

## 2022-01-12 IMAGING — MR MR HEAD WO/W CM
12 series · 48 of 48 positions shown · IV contrast (multihance)
Comparison: Prior head CT from 07/19/2018.

CLINICAL DATA: Initial evaluation for near syncopal episodes with

EXAM:
MRI HEAD WITHOUT AND WITH CONTRAST
TECHNIQUE: Multiplanar, multiecho pulse sequences of the brain and surrounding
structures were obtained without and with intravenous contrast.
CONTRAST:  15mL MULTIHANCE GADOBENATE DIMEGLUMINE 529 MG/ML IV SOLN

[Series 2: T1 · sagittal · 5.0mm · 0.45mm/px · 1 of 21 slices shown]
[im 1/21]
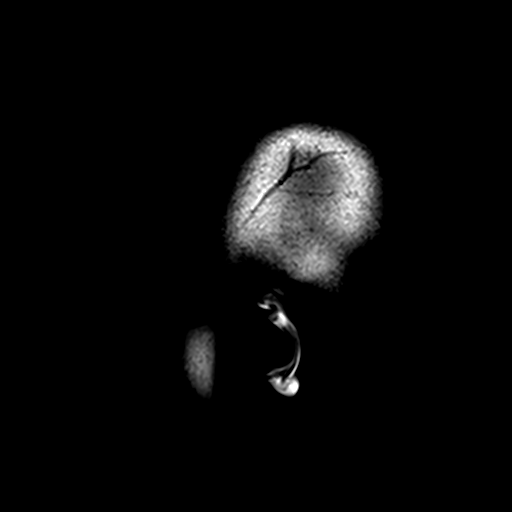

[Series 3: DWI · axial · 3.0mm · 1.80mm/px · z∈[-41,+105]mm · 7 of 100 slices shown (1 of 4)]
[im 1/100]
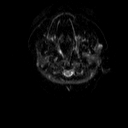
[im 17/100]
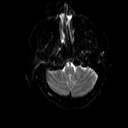
[im 34/100]
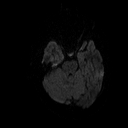
[im 50/100]
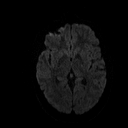
[im 67/100]
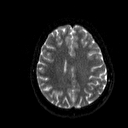
[im 83/100]
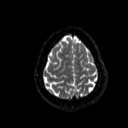
[im 100/100]
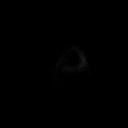

[Series 4: DWI · axial · 3.0mm · 1.80mm/px · z∈[-41,+105]mm · 3 of 50 slices shown (2 of 4)]
[im 1/50]
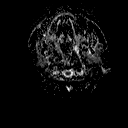
[im 25/50]
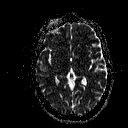
[im 50/50]
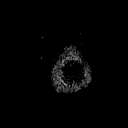

[Series 5: DWI · coronal · 5.0mm · 1.80mm/px · 5 of 68 slices shown (3 of 4)]
[im 1/68]
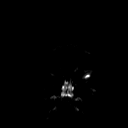
[im 17/68]
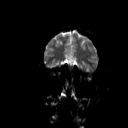
[im 34/68]
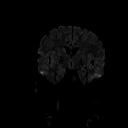
[im 51/68]
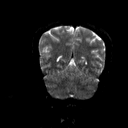
[im 68/68]
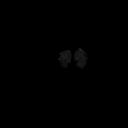

[Series 6: DWI · coronal · 5.0mm · 1.80mm/px · 2 of 34 slices shown (4 of 4)]
[im 1/34]
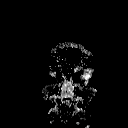
[im 34/34]
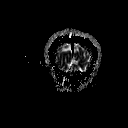

[Series 7: T2 · axial · 5.0mm · 0.51mm/px · z∈[-42,+104]mm · 2 of 22 slices shown (1 of 2)]
[im 1/22]
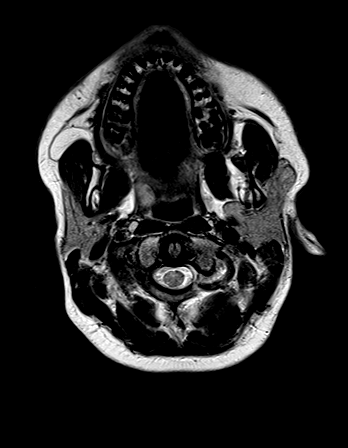
[im 22/22]
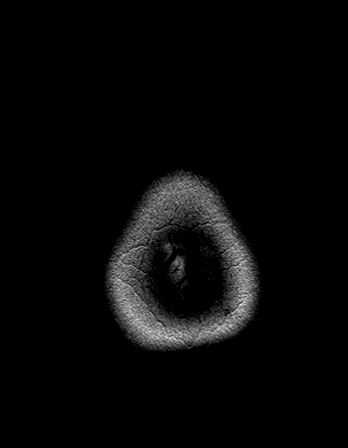

[Series 8: FLAIR · axial · 3.0mm · 0.45mm/px · z∈[-37,+98]mm · 2 of 30 slices shown]
[im 1/30]
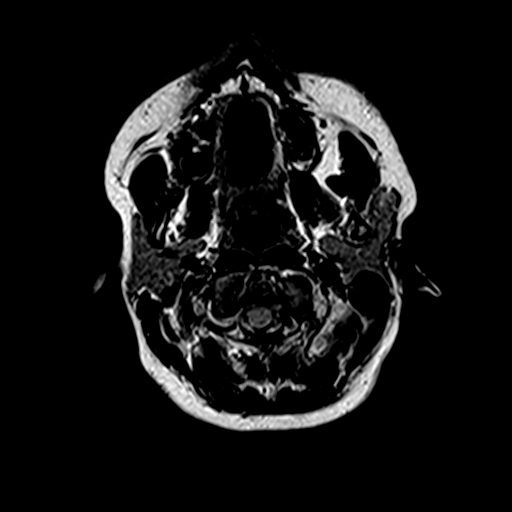
[im 30/30]
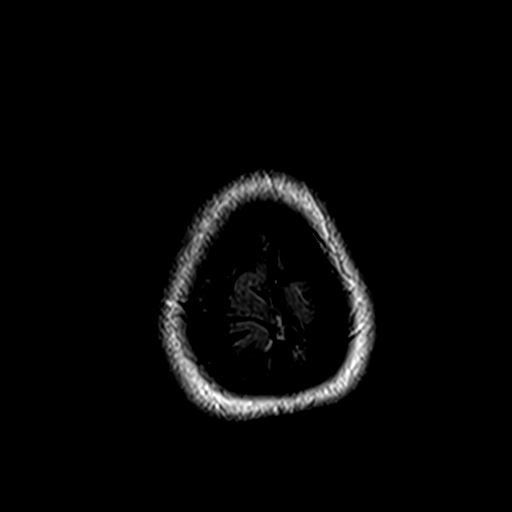

[Series 10: swi_images · axial · 4.0mm · 0.90mm/px · z∈[-30,+110]mm · 2 of 36 slices shown]
[im 1/36]
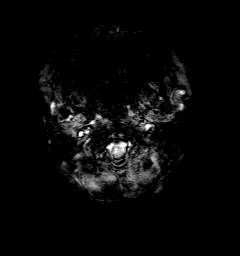
[im 36/36]
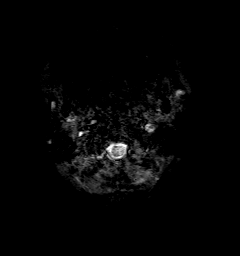

[Series 11: t1_mpr_tra · axial · 1.0mm · 0.71mm/px · z∈[-43,+100]mm · 10 of 144 slices shown (1 of 2)]
[im 1/144]
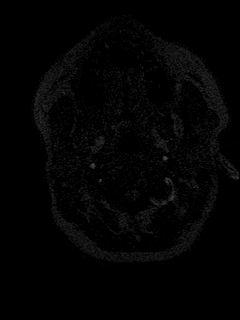
[im 16/144]
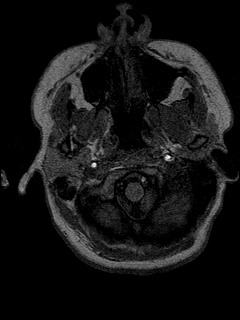
[im 32/144]
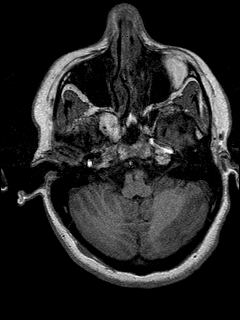
[im 48/144]
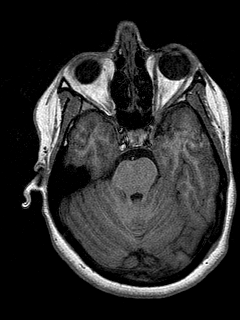
[im 64/144]
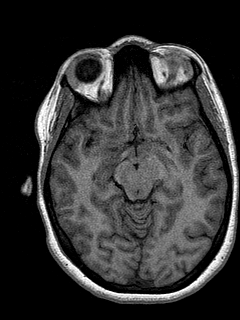
[im 80/144]
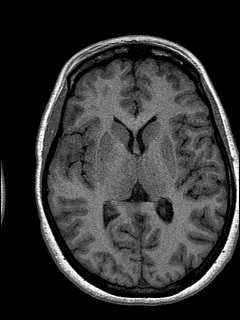
[im 96/144]
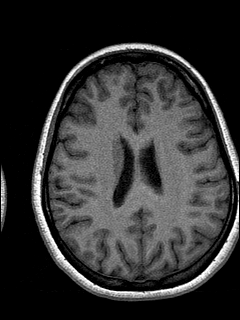
[im 112/144]
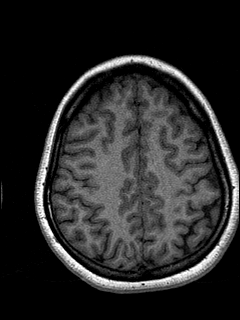
[im 128/144]
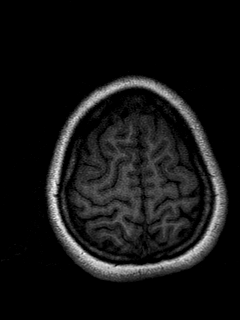
[im 144/144]
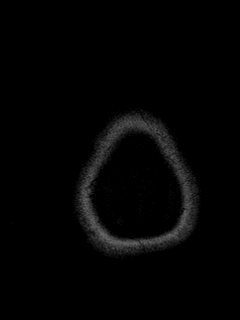

[Series 12: T2 · coronal · 5.0mm · 0.45mm/px · 2 of 25 slices shown (2 of 2)]
[im 1/25]
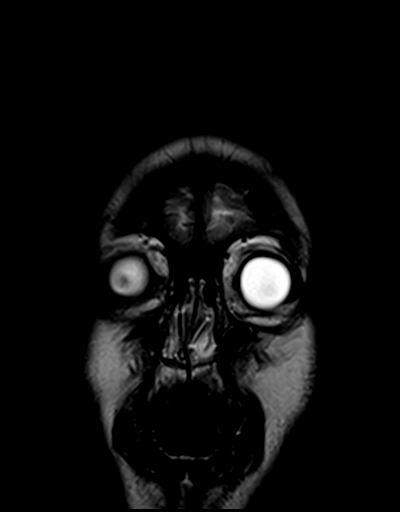
[im 25/25]
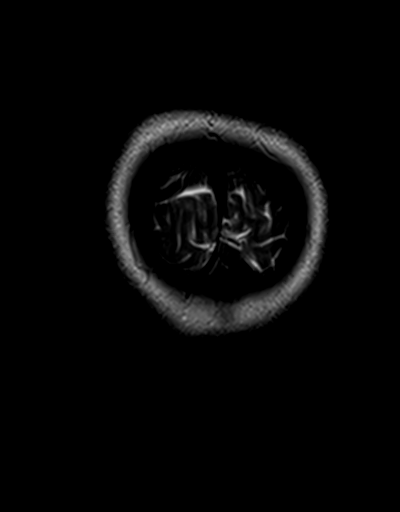

[Series 13: t1_mpr_tra · axial · 1.0mm · 0.75mm/px · z∈[-44,+99]mm · 10 of 144 slices shown (2 of 2)]
[im 1/144]
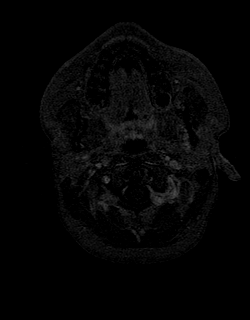
[im 16/144]
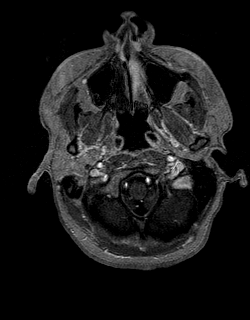
[im 32/144]
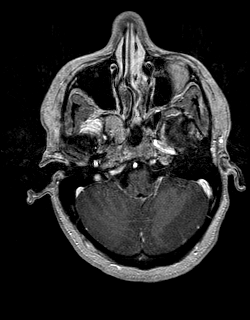
[im 48/144]
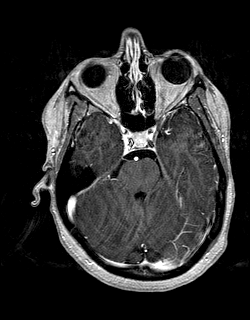
[im 64/144]
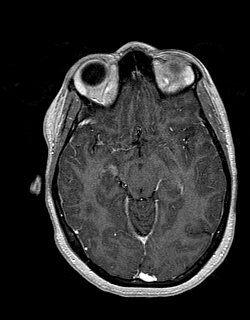
[im 80/144]
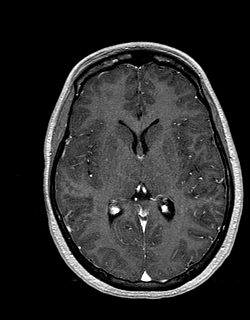
[im 96/144]
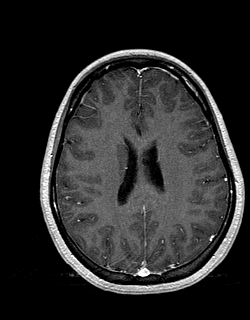
[im 112/144]
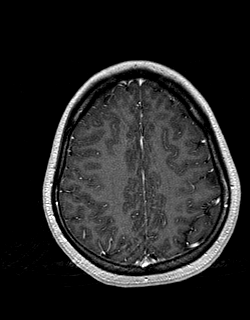
[im 128/144]
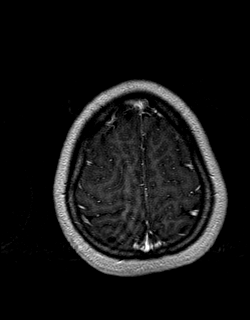
[im 144/144]
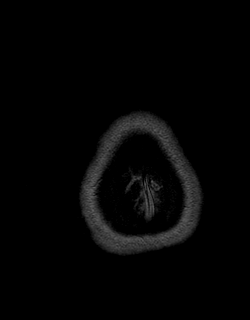

[Series 14: post cor · coronal · 5.0mm · 0.45mm/px · 2 of 25 slices shown]
[im 1/25]
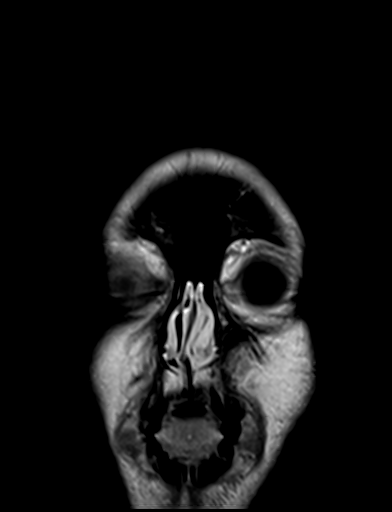
[im 25/25]
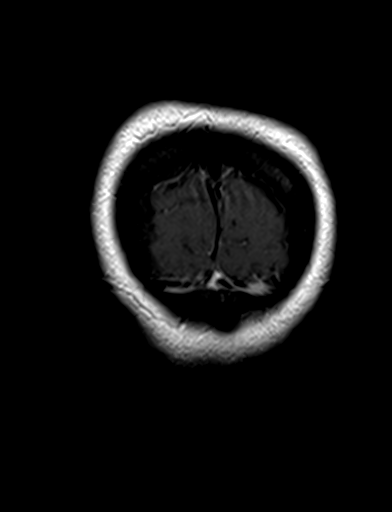

[48 of 48 positions shown; findings below may reference images not displayed]

FINDINGS: Brain: Cerebral volume within normal limits for patient age. Few
scattered subcentimeter foci of T2/FLAIR hyperintensities noted
involving the periventricular and subcortical white matter of both
cerebral hemispheres. Most prominent of these foci measures 5 mm
adjacent to the left temporal horn (series 8, image 12). Findings
are nonspecific, but mild in nature.

No abnormal foci of restricted diffusion to suggest acute or
subacute ischemia. Gray-white matter differentiation well
maintained. No encephalomalacia to suggest chronic infarction. No
foci of susceptibility artifact to suggest acute or chronic
intracranial hemorrhage.

No mass lesion, midline shift or mass effect. No hydrocephalus. No
extra-axial fluid collection. Major dural sinuses are grossly
patent.

Pituitary gland and suprasellar region are normal. Midline
structures intact and normal.

No abnormal enhancement.

Vascular: Major intracranial vascular flow voids well maintained and
normal in appearance.

Skull and upper cervical spine: Craniocervical junction normal.
Visualized upper cervical spine within normal limits. Bone marrow
signal intensity normal. No scalp soft tissue abnormality.

Sinuses/Orbits: Globes and orbital soft tissues within normal
limits.

Paranasal sinuses are clear. No mastoid effusion. Inner ear
structures normal.

Other: None.
IMPRESSION: 1. Few scattered foci of T2/FLAIR hyperintensity involving the
periventricular and subcortical white matter both cerebral
hemispheres, nonspecific. Differential considerations are broad, and
include sequelae of accelerated/hereditary chronic small-vessel
ischemia, changes related to prior trauma, hypercoagulable state,
vasculitis, sequelae of complicated migraines, prior infectious or
inflammatory process, or possibly demyelination. No associated
enhancement.
2. Otherwise unremarkable and normal brain MRI.

## 2022-01-13 ENCOUNTER — Ambulatory Visit: Payer: Managed Care, Other (non HMO) | Admitting: Diagnostic Neuroimaging

## 2022-02-11 ENCOUNTER — Other Ambulatory Visit: Payer: Self-pay

## 2022-02-11 ENCOUNTER — Emergency Department (HOSPITAL_BASED_OUTPATIENT_CLINIC_OR_DEPARTMENT_OTHER)
Admission: EM | Admit: 2022-02-11 | Discharge: 2022-02-11 | Disposition: A | Discharge status: Home / Self Care | Payer: Managed Care, Other (non HMO) | Attending: Emergency Medicine | Admitting: Emergency Medicine

## 2022-02-11 ENCOUNTER — Encounter (HOSPITAL_BASED_OUTPATIENT_CLINIC_OR_DEPARTMENT_OTHER): Payer: Self-pay | Admitting: Urology

## 2022-02-11 ENCOUNTER — Emergency Department (HOSPITAL_BASED_OUTPATIENT_CLINIC_OR_DEPARTMENT_OTHER): Payer: Managed Care, Other (non HMO) | Admitting: Radiology

## 2022-02-11 ENCOUNTER — Emergency Department (HOSPITAL_BASED_OUTPATIENT_CLINIC_OR_DEPARTMENT_OTHER): Payer: Managed Care, Other (non HMO)

## 2022-02-11 DIAGNOSIS — S93401A Sprain of unspecified ligament of right ankle, initial encounter: Secondary | ICD-10-CM | POA: Insufficient documentation

## 2022-02-11 DIAGNOSIS — Y9301 Activity, walking, marching and hiking: Secondary | ICD-10-CM | POA: Diagnosis not present

## 2022-02-11 DIAGNOSIS — X501XXA Overexertion from prolonged static or awkward postures, initial encounter: Secondary | ICD-10-CM | POA: Diagnosis not present

## 2022-02-11 DIAGNOSIS — S99911A Unspecified injury of right ankle, initial encounter: Secondary | ICD-10-CM | POA: Diagnosis present

## 2022-02-11 DIAGNOSIS — Z91010 Allergy to peanuts: Secondary | ICD-10-CM | POA: Insufficient documentation

## 2022-02-11 MED ORDER — IBUPROFEN 600 MG PO TABS: 600.0000 mg | ORAL_TABLET | Freq: Three times a day (TID) | ORAL | 0 refills | Status: AC | PRN

## 2022-02-11 MED ORDER — IBUPROFEN 400 MG PO TABS
600.0000 mg | ORAL_TABLET | Freq: Once | ORAL | Status: AC
  Administered 2022-02-11: 600 mg via ORAL
  Filled 2022-02-11: qty 1

## 2022-02-11 NOTE — Discharge Instructions (Signed)
Take the medications as needed for pain.  Follow-up with a primary care doctor, orthopedic or sports medicine doctor if the symptoms do not resolve over the next week

## 2022-02-11 NOTE — ED Triage Notes (Signed)
Approx 1 hr PTA Right ankle injury while walking down stairs States "felt pop" Unable to bear wt r/t pain

## 2022-02-11 NOTE — ED Provider Notes (Signed)
[facility] DE[facility] Provider Note   CSN: [id number] Arrival date & time: 02/11/22  1300     History  Chief Complaint  Patient presents with   Ankle Pain    Kari Carrillo is a 21 y.o. female.   Ankle Pain  Patient presents to the ED for evaluation after ankle injury.  Patient was walking down the stairs when she twisted her ankle and felt a pop.  Patient has not been able to bear weight since.  She is having pain in the outside of her right ankle and foot.  She denies any other injuries  Home Medications Prior to Admission medications   Medication Sig Start Date End Date Taking? Authorizing Provider  ibuprofen (ADVIL) 600 MG tablet Take 1 tablet (600 mg total) by mouth every 8 (eight) hours as needed for up to 7 days for fever, headache, mild pain or moderate pain. 02/11/22 02/18/22 Yes Dorie Rank, MD  albuterol (VENTOLIN HFA) 108 (90 Base) MCG/ACT inhaler Inhale 1-2 puffs into the lungs every 4 (four) hours as needed for wheezing or shortness of breath. 09/22/21   Valentina Shaggy, MD  beclomethasone (QVAR) 40 MCG/ACT inhaler Inhale 1 puff into the lungs as needed.    [provider]  betamethasone valerate ointment (VALISONE) 0.1 % Use a pea sized amount topically BID for up to 2 weeks as needed 09/26/21   Salvadore Dom, MD  budesonide (RHINOCORT AQUA) 32 MCG/ACT nasal spray Place 1 spray into both nostrils 2 (two) times daily as needed. 05/15/21   Valentina Shaggy, MD  EPINEPHrine 0.3 mg/0.3 mL IJ SOAJ injection Inject 0.3 mg into the muscle once as needed (allergic reaction).     [provider]  fexofenadine (ALLEGRA) 180 MG tablet Take 1 tablet (180 mg total) by mouth daily. 05/15/21   Valentina Shaggy, MD  loratadine (CLARITIN) 10 MG tablet Take 10 mg by mouth daily as needed for allergies.    [provider]  molnupiravir EUA (LAGEVRIO) 200 MG CAPS capsule Take 4 capsules by mouth every 12 hours for 5 days.  11/17/21     naproxen (NAPROSYN) 250 MG tablet Take by mouth 2 (two) times daily with a meal.    [provider]  naproxen (NAPROSYN) 500 MG tablet Take by mouth. 08/21/21   [provider]  omeprazole (PRILOSEC OTC) 20 MG tablet Take by mouth.    [provider]  ondansetron (ZOFRAN) 4 MG tablet Take 1 tablet (4 mg total) by mouth every 8 (eight) hours as needed for nausea or vomiting. Patient not taking: Reported on 12/10/2021 05/02/21   Salvadore Dom, MD  Topiramate ER (TROKENDI XR) 50 MG CP24 Take 50 mg by mouth daily. 12/10/21   Gregor Hams, MD  traZODone (DESYREL) 50 MG tablet TAKE 1 TABLET BY MOUTH AT BEDTIME AS NEEDED FOR SLEEP. 11/19/21   Gregor Hams, MD  triamcinolone cream (KENALOG) 0.1 % Apply topically. 08/07/21   [provider]      Allergies    Other, Other, Shellfish allergy, Carrot [daucus carota], and Peanut (diagnostic)    Review of Systems   Review of Systems  Musculoskeletal:  Negative for joint swelling.   Physical Exam Updated Vital Signs BP 112/69 (BP Location: Left Arm)    Pulse 71    Temp 97.9 F (36.6 C) (Oral)    Resp 18    Ht 1.727 m (5\' 8" )    Wt 81.6 kg    LMP  01/04/2022 (Approximate)    SpO2 100%    BMI 27.35 kg/m  Physical Exam Vitals and nursing note reviewed.  Constitutional:      General: She is not in acute distress.    Appearance: She is well-developed.  HENT:     Head: Normocephalic and atraumatic.     Right Ear: External ear normal.     Left Ear: External ear normal.  Eyes:     General: No scleral icterus.       Right eye: No discharge.        Left eye: No discharge.     Conjunctiva/sclera: Conjunctivae normal.  Neck:     Trachea: No tracheal deviation.  Cardiovascular:     Rate and Rhythm: Normal rate.  Pulmonary:     Effort: Pulmonary effort is normal. No respiratory distress.     Breath sounds: No stridor.  Abdominal:     General: There is no distension.  Musculoskeletal:         General: No swelling or deformity.     Cervical back: Neck supple.     Right ankle: No swelling or deformity. Tenderness present over the lateral malleolus and base of 5th metatarsal.  Skin:    General: Skin is warm and dry.     Findings: No rash.  Neurological:     Mental Status: She is alert.     Cranial Nerves: Cranial nerve deficit: no gross deficits.    ED Results / Procedures / Treatments   Labs (all labs ordered are listed, but only abnormal results are displayed) Labs Reviewed - No data to display  EKG None  Radiology DG Ankle Complete Right  Result Date: 02/11/2022 CLINICAL DATA:  Twisted ankle. EXAM: RIGHT ANKLE - COMPLETE 3+ VIEW COMPARISON:  None. FINDINGS: The ankle mortise is maintained. No acute ankle fracture is identified. No definite ankle joint effusion. The mid and hindfoot bony structures are intact. IMPRESSION: No acute bony findings. Electronically Signed   By: Marijo Sanes M.D.   On: 02/11/2022 13:36   DG Foot 2 Views Right  Result Date: 02/11/2022 CLINICAL DATA:  Right ankle injury walking down the stairs. EXAM: RIGHT FOOT - 2 VIEW COMPARISON:  None. FINDINGS: There is no evidence of fracture or dislocation. There is no evidence of arthropathy or other focal bone abnormality. Soft tissues are unremarkable. IMPRESSION: No acute osseous abnormality. Electronically Signed   By: Lovey Newcomer M.D.   On: 02/11/2022 13:49    Procedures Procedures    Medications Ordered in ED Medications  ibuprofen (ADVIL) tablet 600 mg (600 mg Oral Given 02/11/22 1409)    ED Course/ Medical Decision Making/ A&P                           Medical Decision Making Amount and/or Complexity of Data Reviewed Radiology: ordered.  Risk Prescription drug management.   Patient presented with ankle injury.  Right ankle and foot images reviewed.  Radiology report reviewed.  No signs of fracture or dislocation.  Consistent with sprain.  Will DC home with crutches ASO splint and  discussed outpatient follow-up if symptoms do not resolve        Final Clinical Impression(s) / ED Diagnoses Final diagnoses:  Sprain of right ankle, unspecified ligament, initial encounter    Rx / DC Orders ED Discharge Orders          Ordered    ibuprofen (ADVIL) 600 MG tablet  Every 8  hours PRN        02/11/22 1409              Dorie Rank, MD 02/11/22 1410

## 2022-02-11 NOTE — ED Notes (Signed)
RN provided AVS using Teachback Method. Patient verbalizes understanding of Discharge Instructions. Opportunity for Questioning and Answers were provided by RN. Patient Discharged from ED in wheelchair to Home with Family.

## 2022-02-16 ENCOUNTER — Encounter: Payer: Self-pay | Admitting: Diagnostic Neuroimaging

## 2022-02-16 ENCOUNTER — Ambulatory Visit (INDEPENDENT_AMBULATORY_CARE_PROVIDER_SITE_OTHER): Payer: Managed Care, Other (non HMO) | Admitting: Diagnostic Neuroimaging

## 2022-02-16 VITALS — BP 117/72 | HR 76 | Ht 68.0 in | Wt 179.4 lb

## 2022-02-16 DIAGNOSIS — G43009 Migraine without aura, not intractable, without status migrainosus: Secondary | ICD-10-CM | POA: Diagnosis not present

## 2022-02-16 MED ORDER — RIZATRIPTAN BENZOATE 10 MG PO TBDP: 10.0000 mg | ORAL_TABLET | ORAL | 11 refills | Status: DC | PRN

## 2022-02-16 MED ORDER — TOPIRAMATE ER 50 MG PO CAP24: 50.0000 mg | ORAL_CAPSULE | Freq: Every day | ORAL | 3 refills | Status: DC

## 2022-02-16 NOTE — Progress Notes (Signed)
[facility]  PATIENT: Kari Carrillo DOB: [birth year]  REFERRING CLINICIAN: Janie Morning, DO HISTORY FROM: patient  REASON FOR VISIT: follow up   HISTORICAL  CHIEF COMPLAINT:  Chief Complaint  Patient presents with   Neck Pain    Rm 6, 6 month FU, parents, [name] "was put on Trokendi by sports med MD after concussion, I take it as needed for migraine, about 6-7 x a month"    Migraine    HISTORY OF PRESENT ILLNESS:   UPDATE (02/16/22, VRP): Since last visit, still with 5-6 headaches per month. Some light sensitivity. Tried TPX and trokendi; now on trokendi as needed.   UPDATE (07/08/21, VRP): Since last visit, neck pain is improved with physical therapy.  Continues to have headaches.  Previously headaches were hours at a time, left or right-sided, throbbing pain.  No nausea or vomiting.  No sensitive light or sound.  Now patient having headaches that are more generalized, back of the head, back of the neck, lasting 2 to 3 minutes at a time.  She still feels a heartbeat throbbing sensation in her head.  UPDATE (08/21/20, VRP): Since last visit, doing well, except for occ pre-syncope events (2 per month). Trying to eat and drink more, but often skips breakfast and has restrictions due to food allergies, sensitivities and preference. Had  Some irregular menses, elevated prolactin level, and wanted to review MRI brain from prior (pituitary review). More stress at school.   PRIOR HPI: 21 year old female here for evaluation of lightheadedness and dizziness.  02/27/2020 patient was at home.  Her father had a syncopal event and had to go to the hospital.  Later that morning she was at the sink washing dishes when all of a sudden she felt lightheaded and dizzy.  Her mother was there and helped her sit down.  Patient briefly had tunnel vision but did not fully lose consciousness.  EMS called to patient's home and initial blood pressure was 64/48.  She was having nausea and  headaches.  Blood pressure improved to 99/62 upon arrival to ER.  Since that time patient has had follow-up with PCP and MRI of the brain.  Few nonspecific T2 hyperintensities were noted.  Patient's father has multiple sclerosis (also patient of mine) and therefore consultation was requested.  Patient has been having some intermittent balance issues urinary frequency issues.  Also having some intermittent visual changes.   REVIEW OF SYSTEMS: Full 14 system review of systems performed and negative with exception of: as per HPI.   ALLERGIES: Allergies  Allergen Reactions   Other Anaphylaxis, Shortness Of Breath and Other (See Comments)    Raw fruit, A1 sauce- throat closes Other reaction(s): tongue nuumbness   Other Anaphylaxis and Other (See Comments)    *per pt* all fruits and vegetables that grow on trees- including medication containing these products.    Shellfish Allergy Anaphylaxis   Carrot [Daucus Carota]     Throat get itchy raw or cooked   Peanut (Diagnostic)     Other reaction(s): throat itching    HOME MEDICATIONS: Outpatient Medications Prior to Visit  Medication Sig Dispense Refill   albuterol (VENTOLIN HFA) 108 (90 Base) MCG/ACT inhaler Inhale 1-2 puffs into the lungs every 4 (four) hours as needed for wheezing or shortness of breath. 1 each 1   beclomethasone (QVAR) 40 MCG/ACT inhaler Inhale 1 puff into the lungs as needed.     betamethasone valerate ointment (VALISONE) 0.1 % Use a pea sized amount topically  BID for up to 2 weeks as needed 30 g 0   budesonide (RHINOCORT AQUA) 32 MCG/ACT nasal spray Place 1 spray into both nostrils 2 (two) times daily as needed. 15 mL 0   EPINEPHrine 0.3 mg/0.3 mL IJ SOAJ injection Inject 0.3 mg into the muscle once as needed (allergic reaction).      fexofenadine (ALLEGRA) 180 MG tablet Take 1 tablet (180 mg total) by mouth daily. 90 tablet 0   ibuprofen (ADVIL) 600 MG tablet Take 1 tablet (600 mg total) by mouth every 8 (eight) hours as  needed for up to 7 days for fever, headache, mild pain or moderate pain. 21 tablet 0   loratadine (CLARITIN) 10 MG tablet Take 10 mg by mouth daily as needed for allergies.     molnupiravir EUA (LAGEVRIO) 200 MG CAPS capsule Take 4 capsules by mouth every 12 hours for 5 days. 40 capsule 0   omeprazole (PRILOSEC OTC) 20 MG tablet Take by mouth.     ondansetron (ZOFRAN) 4 MG tablet Take 1 tablet (4 mg total) by mouth every 8 (eight) hours as needed for nausea or vomiting. 20 tablet 0   traZODone (DESYREL) 50 MG tablet TAKE 1 TABLET BY MOUTH AT BEDTIME AS NEEDED FOR SLEEP. 90 tablet 1   triamcinolone cream (KENALOG) 0.1 % Apply topically.     naproxen (NAPROSYN) 500 MG tablet Take by mouth.     Topiramate ER (TROKENDI XR) 50 MG CP24 Take 50 mg by mouth daily. 90 capsule 3   naproxen (NAPROSYN) 250 MG tablet Take by mouth 2 (two) times daily with a meal.     No facility-administered medications prior to visit.    PAST MEDICAL HISTORY: Past Medical History:  Diagnosis Date   Allergy-induced asthma    Asthma    Balance problem    Eczema    GERD (gastroesophageal reflux disease)    Hyperkalemia    Near syncope    Urinary frequency    Vision disturbance     PAST SURGICAL HISTORY: No past surgical history on file.  FAMILY HISTORY: Family History  Problem Relation Age of Onset   Asthma Mother    Allergic rhinitis Mother    Eczema Mother    Food Allergy Mother    Urticaria Mother    Diabetes Father    Multiple sclerosis Father    Diabetes Paternal Grandmother    Heart attack Paternal Grandmother    Angioedema Neg Hx    Immunodeficiency Neg Hx     SOCIAL HISTORY: Social History   Socioeconomic History   Marital status: Single    Spouse name: Not on file   Number of children: Not on file   Years of education: Not on file   Highest education level: Some college, no degree  Occupational History   Not on file  Tobacco Use   Smoking status: Never   Smokeless tobacco: Never   Vaping Use   Vaping Use: Never used  Substance and Sexual Activity   Alcohol use: No   Drug use: No   Sexual activity: Never    Birth control/protection: None  Other Topics Concern   Not on file  Social History Narrative          Social Determinants of Health   Financial Resource Strain: Not on file  Food Insecurity: Not on file  Transportation Needs: Not on file  Physical Activity: Not on file  Stress: Not on file  Social Connections: Not on file  Intimate  Partner Violence: Not on file     PHYSICAL EXAM  GENERAL EXAM/CONSTITUTIONAL: Vitals:  Vitals:   02/16/22 1451  BP: 117/72  Pulse: 76  Weight: 179 lb 6.4 oz (81.4 kg)  Height: 5\' 8"  (1.727 m)   Body mass index is 27.28 kg/m. Wt Readings from Last 3 Encounters:  02/16/22 179 lb 6.4 oz (81.4 kg)  02/11/22 179 lb 14.3 oz (81.6 kg)  12/10/21 180 lb (81.6 kg)   Patient is in no distress; well developed, nourished and groomed; neck is supple  CARDIOVASCULAR: Examination of carotid arteries is normal; no carotid bruits Regular rate and rhythm, no murmurs Examination of peripheral vascular system by observation and palpation is normal  EYES: Ophthalmoscopic exam of optic discs and posterior segments is normal; no papilledema or hemorrhages No results found.  MUSCULOSKELETAL: Gait, strength, tone, movements noted in Neurologic exam below  NEUROLOGIC: MENTAL STATUS:  No flowsheet data found. awake, alert, oriented to person, place and time recent and remote memory intact normal attention and concentration language fluent, comprehension intact, naming intact fund of knowledge appropriate  CRANIAL NERVE:  2nd - no papilledema on fundoscopic exam 2nd, 3rd, 4th, 6th - pupils equal and reactive to light, visual fields full to confrontation, extraocular muscles intact, no nystagmus 5th - facial sensation symmetric 7th - facial strength symmetric 8th - hearing intact 9th - palate elevates symmetrically,  uvula midline 11th - shoulder shrug symmetric 12th - tongue protrusion midline  MOTOR:  normal bulk and tone, full strength in the BUE, BLE  SENSORY:  normal and symmetric to light touch, temperature, vibration  COORDINATION:  finger-nose-finger, fine finger movements normal  REFLEXES:  deep tendon reflexes present and symmetric  GAIT/STATION:  narrow based gait    DIAGNOSTIC DATA (LABS, IMAGING, TESTING) - I reviewed patient records, labs, notes, testing and imaging myself where available.  Lab Results  Component Value Date   WBC 7.4 07/09/2021   HGB 11.6 (L) 07/09/2021   HCT 33.9 (L) 07/09/2021   MCV 85.9 07/09/2021   PLT 257.0 07/09/2021      Component Value Date/Time   NA 137 07/09/2021 1458   NA 140 08/16/2020 1130   K 3.5 07/09/2021 1458   CL 104 07/09/2021 1458   CO2 24 07/09/2021 1458   GLUCOSE 100 (H) 07/09/2021 1458   BUN 7 07/09/2021 1458   BUN 8 08/16/2020 1130   CREATININE 0.55 07/09/2021 1458   CREATININE 0.58 05/02/2021 0956   CALCIUM 9.3 07/09/2021 1458   PROT 7.3 07/09/2021 1458   ALBUMIN 4.4 07/09/2021 1458   AST 20 07/09/2021 1458   ALT 12 07/09/2021 1458   ALKPHOS 69 07/09/2021 1458   BILITOT 0.3 07/09/2021 1458   GFRNONAA 132 08/16/2020 1130   GFRAA 152 08/16/2020 1130   Lab Results  Component Value Date   CHOL 155 05/02/2021   HDL 55 05/02/2021   LDLCALC 87 05/02/2021   TRIG 44 05/02/2021   CHOLHDL 2.8 05/02/2021   Lab Results  Component Value Date   HGBA1C 5.3 05/02/2021   No results found for: VITAMINB12 Lab Results  Component Value Date   TSH 0.578 08/06/2020     04/25/20 MRI brain [I reviewed images myself and agree with interpretation. Normal pituitary. -VRP]  1. Few scattered foci of T2/FLAIR hyperintensity involving the periventricular and subcortical white matter both cerebral hemispheres, nonspecific. Differential considerations are broad, and include sequelae of accelerated/hereditary chronic  small-vessel ischemia, changes related to prior trauma, hypercoagulable state, vasculitis, sequelae of complicated  migraines, prior infectious or inflammatory process, or possibly demyelination. No associated enhancement. 2. Otherwise unremarkable and normal brain MRI.  05/24/20 MRI cervical / thoracic spine - normal  04/17/21 MRI brain (without) demonstrating: - Few scattered punctate foci of bifrontal and left temporal gliosis, stable from 04/25/2020. - No acute findings.  No change from prior MRI.   04/17/21 MRI cervical spine - normal    ASSESSMENT AND PLAN  21 y.o. year old female here with:   Dx:  1. Atypical migraine      PLAN:  HEADACHES (throbbing, neck pain, unilateral; hours; -N/V, - photo/phono)  MIGRAINE TREATMENT PLAN:  MIGRAINE PREVENTION  LIFESTYLE CHANGES -Stop or avoid smoking -Decrease or avoid caffeine / alcohol -Eat and sleep on a regular schedule -Exercise several times per week - continue trokendi 50mg  (change to bedtime)  MIGRAINE RESCUE  - ibuprofen, tylenol as needed - rizatriptan (Maxalt) 10mg  as needed for breakthrough headache; may repeat x 1 after 2 hours; max 2 tabs per day or 8 per month  Meds ordered this encounter  Medications   rizatriptan (MAXALT-MLT) 10 MG disintegrating tablet    Sig: Take 1 tablet (10 mg total) by mouth as needed for migraine. May repeat in 2 hours if needed    Dispense:  9 tablet    Refill:  11   Topiramate ER (TROKENDI XR) 50 MG CP24    Sig: Take 50 mg by mouth at bedtime.    Dispense:  90 capsule    Refill:  3   Return in about 1 year (around 02/16/2023) for MyChart visit (15 min).    Penni Bombard, MD 5/72/6203, 5:59 PM Certified in Neurology, Neurophysiology and Neuroimaging  [facility] [address], [place], [place] [address] [phone]

## 2022-05-06 NOTE — Progress Notes (Signed)
    Kari Carrillo is a 21 y.o. female who presents to [place] [facility] today for R elbow pain.  She was last seen by Dr. Georgina Snell on 12/10/21 for f/u of concussion.  Today, pt reports R elbow pain since April 2023 after she fell and hit her elbow while playing laser tag.  She locates her pain to her R post elbow just distal to her olecranon.  Radiating pain: no R elbow swelling: no Aggravating factors: at night when trying to go to bed/sleep  Treatments tried: nothing currently    Pertinent review of systems: No fevers or chills  Relevant historical information: Prior concussion   Exam:  BP 96/68 (BP Location: Left Arm, Patient Position: Sitting, Cuff Size: Normal)   Pulse 70   Ht '5\' 8"'$  (1.727 m)   Wt 179 lb 6.4 oz (81.4 kg)   SpO2 96%   BMI 27.28 kg/m  General: Well Developed, well nourished, and in no acute distress.   MSK: Right elbow normal-appearing normal motion. Normal strength. Minimally tender to palpation at distal ulna about 2 cm distal to the olecranon. No pain reproduced with resisted wrist flexion extension pronation supination or elbow motion strength testing. Pulses capillary fill and sensation are intact distally.    Lab and Radiology Results  Diagnostic Limited MSK Ultrasound of: Right elbow ulna Area of tenderness visualized with ultrasound. Small hypoechoic structure 2 mm in height just medial to the spine of the olecranon visible.  This is superficial to the muscles. Slight increase vascular activity in this region on Doppler. Appearance of structure consistent with resolving hematoma. Impression: Resolving hematoma at right ulna     Assessment and Plan: 21 y.o. female with hematoma right elbow.  Improving.  Plan to try Voltaren gel and if that does not work well enough we will try compression with body helix compression elbow sleeve.  Recheck back as needed.   PDMP not reviewed this encounter. Orders Placed This  Encounter  Procedures   [place] LIMITED JOINT SPACE STRUCTURES UP RIGHT(NO LINKED CHARGES)    Order Specific Question:   Reason for Exam (SYMPTOM  OR DIAGNOSIS REQUIRED)    Answer:   R elbow pain    Order Specific Question:   Preferred imaging location?    Answer:   [place]   No orders of the defined types were placed in this encounter.    Discussed warning signs or symptoms. Please see discharge instructions. Patient expresses understanding.   The above documentation has been reviewed and is accurate and complete [name], M.D.

## 2022-05-07 ENCOUNTER — Ambulatory Visit (INDEPENDENT_AMBULATORY_CARE_PROVIDER_SITE_OTHER): Payer: Managed Care, Other (non HMO) | Admitting: Family Medicine

## 2022-05-07 ENCOUNTER — Ambulatory Visit: Payer: Self-pay

## 2022-05-07 ENCOUNTER — Encounter: Payer: Self-pay | Admitting: Family Medicine

## 2022-05-07 VITALS — BP 96/68 | HR 70 | Ht 68.0 in | Wt 179.4 lb

## 2022-05-07 DIAGNOSIS — M25521 Pain in right elbow: Secondary | ICD-10-CM | POA: Diagnosis not present

## 2022-05-07 NOTE — Patient Instructions (Addendum)
Good to see you today.  Please use Voltaren gel (Generic Diclofenac Gel) up to 4x daily for pain as needed.  This is available over-the-counter as both the name brand Voltaren gel and the generic diclofenac gel.   I recommend you obtained a compression sleeve to help with your joint problems. There are many options on the market however I recommend obtaining a full elbow Body Helix compression sleeve.  You can find information (including how to appropriate measure yourself for sizing) can be found at www.Body http://www.lambert.com/.  Many of these products are health savings account (HSA) eligible.   You can use the compression sleeve at any time throughout the day but is most important to use while being active as well as for 2 hours post-activity.   It is appropriate to ice following activity with the compression sleeve in place.   Follow-up: as needed.

## 2022-05-26 ENCOUNTER — Ambulatory Visit: Payer: Managed Care, Other (non HMO) | Admitting: Podiatry

## 2022-06-19 ENCOUNTER — Ambulatory Visit: Payer: Managed Care, Other (non HMO)

## 2022-06-19 ENCOUNTER — Other Ambulatory Visit: Payer: Self-pay

## 2022-06-19 ENCOUNTER — Emergency Department (HOSPITAL_BASED_OUTPATIENT_CLINIC_OR_DEPARTMENT_OTHER)
Admission: EM | Admit: 2022-06-19 | Discharge: 2022-06-19 | Disposition: A | Discharge status: Home / Self Care | Payer: Managed Care, Other (non HMO) | Attending: Emergency Medicine | Admitting: Emergency Medicine

## 2022-06-19 ENCOUNTER — Ambulatory Visit (INDEPENDENT_AMBULATORY_CARE_PROVIDER_SITE_OTHER): Payer: Managed Care, Other (non HMO) | Admitting: Podiatry

## 2022-06-19 ENCOUNTER — Ambulatory Visit (INDEPENDENT_AMBULATORY_CARE_PROVIDER_SITE_OTHER): Payer: Managed Care, Other (non HMO)

## 2022-06-19 DIAGNOSIS — M25572 Pain in left ankle and joints of left foot: Secondary | ICD-10-CM | POA: Diagnosis not present

## 2022-06-19 DIAGNOSIS — J45909 Unspecified asthma, uncomplicated: Secondary | ICD-10-CM | POA: Diagnosis not present

## 2022-06-19 DIAGNOSIS — M216X1 Other acquired deformities of right foot: Secondary | ICD-10-CM | POA: Diagnosis not present

## 2022-06-19 DIAGNOSIS — M76821 Posterior tibial tendinitis, right leg: Secondary | ICD-10-CM | POA: Diagnosis not present

## 2022-06-19 DIAGNOSIS — W57XXXA Bitten or stung by nonvenomous insect and other nonvenomous arthropods, initial encounter: Secondary | ICD-10-CM | POA: Diagnosis not present

## 2022-06-19 DIAGNOSIS — Z91010 Allergy to peanuts: Secondary | ICD-10-CM | POA: Diagnosis not present

## 2022-06-19 DIAGNOSIS — Q666 Other congenital valgus deformities of feet: Secondary | ICD-10-CM

## 2022-06-19 DIAGNOSIS — M216X2 Other acquired deformities of left foot: Secondary | ICD-10-CM

## 2022-06-19 DIAGNOSIS — M25571 Pain in right ankle and joints of right foot: Secondary | ICD-10-CM

## 2022-06-19 DIAGNOSIS — T7840XA Allergy, unspecified, initial encounter: Secondary | ICD-10-CM

## 2022-06-19 DIAGNOSIS — M76822 Posterior tibial tendinitis, left leg: Secondary | ICD-10-CM | POA: Diagnosis not present

## 2022-06-19 LAB — PREGNANCY, URINE: Preg Test, Ur: NEGATIVE

## 2022-06-19 MED ORDER — EPINEPHRINE 0.3 MG/0.3ML IJ SOAJ: 0.3000 mg | INTRAMUSCULAR | 0 refills | Status: DC | PRN

## 2022-06-19 MED ORDER — METHYLPREDNISOLONE SODIUM SUCC 125 MG IJ SOLR
125.0000 mg | Freq: Once | INTRAMUSCULAR | Status: AC
  Administered 2022-06-19: 125 mg via INTRAVENOUS
  Filled 2022-06-19: qty 2

## 2022-06-19 MED ORDER — DIPHENHYDRAMINE HCL 50 MG/ML IJ SOLN
25.0000 mg | Freq: Once | INTRAMUSCULAR | Status: AC
  Administered 2022-06-19: 25 mg via INTRAVENOUS
  Filled 2022-06-19: qty 1

## 2022-06-19 MED ORDER — EPINEPHRINE PF 1 MG/ML IJ SOLN
0.3000 mg | Freq: Once | INTRAMUSCULAR | Status: AC
  Administered 2022-06-19: 0.3 mg via INTRAMUSCULAR
  Filled 2022-06-19: qty 1

## 2022-06-19 MED ORDER — PREDNISONE 10 MG PO TABS: 40.0000 mg | ORAL_TABLET | Freq: Every day | ORAL | 0 refills | Status: DC

## 2022-06-19 MED ORDER — MELOXICAM 7.5 MG PO TABS: 7.5000 mg | ORAL_TABLET | Freq: Every day | ORAL | 0 refills | Status: DC | PRN

## 2022-06-19 MED ORDER — EPINEPHRINE 1 MG/10ML IJ SOSY: 0.3000 mg | PREFILLED_SYRINGE | Freq: Once | INTRAMUSCULAR | Status: DC

## 2022-06-19 MED ORDER — EPINEPHRINE 0.3 MG/0.3ML IJ SOAJ
0.3000 mg | Freq: Once | INTRAMUSCULAR | Status: DC
  Filled 2022-06-19: qty 0.3

## 2022-06-19 MED ORDER — FAMOTIDINE IN NACL 20-0.9 MG/50ML-% IV SOLN
20.0000 mg | Freq: Once | INTRAVENOUS | Status: AC
  Administered 2022-06-19: 20 mg via INTRAVENOUS
  Filled 2022-06-19: qty 50

## 2022-06-19 NOTE — Progress Notes (Unsigned)
Subjective: 21 year old female presents the office today for concerns about ankle discomfort.  She points on medial aspect ankles where she gets discomfort.  No recent injury or changes since I last saw her.  She states that at times with a sharp pain and hurts to walk.  No recent treatment.  No numbness or tingling.   Objective: AAO x3, NAD DP/PT pulses palpable bilaterally, CRT less than 3 seconds There is a decreased medial arch upon weightbearing bilaterally.  No joint tenderness seems to be localized along the course of flexor, posterior tibial tendon bilaterally.  Clinically the tendon appears to be intact.  No sign of edema.  No area pinpoint tenderness.  Ankle, subtalar range of motion intact.  Equinus is present.  MMT 5/5.   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  -X-rays were obtained reviewed of bilateral ankles.  3 views were obtained.  No evidence of acute fracture. -Recommend physical therapy and a prescription for PT was written for [facility]. -We discussed shoe modifications arch support.  She can start over-the-counter inserts we discussed different brands. -Prescribed mobic. Discussed side effects of the medication and directed to stop if any are to occur and call the office.  -Discussed stretching, rehab. -Patient encouraged to call the office with any questions, concerns, change in symptoms.   Trula Slade DPM

## 2022-06-19 NOTE — ED Provider Notes (Signed)
[facility] Provider Note   CSN: [id number] Arrival date & time: 06/19/22  1610     History  Chief Complaint  Patient presents with   Allergic Reaction   Shortness of Breath    Kari Carrillo is a 21 y.o. female.   Allergic Reaction Shortness of Breath   Patient with medical history of asthma, allergies (shellfish, peanuts) presents today due to concern about allergic reaction.  States 30 minutes prior to arrival she was stung by a bee on her left finger.  After that she feels like the back of her throat is swelling and its a tickling feeling in her throat.  Feels like is getting harder to swallow.  Not having any chest pain, denies nausea, vomiting, rashes, diarrhea.  She has an EpiPen but has not used it, did not take any medicine prior to arrival.  Has never been stung by a bee before and unsure if this is something that triggers her reactions.  States she had 1 anaphylactic reaction as a child but none since then.  Home Medications Prior to Admission medications   Medication Sig Start Date End Date Taking? Authorizing Provider  albuterol (VENTOLIN HFA) 108 (90 Base) MCG/ACT inhaler Inhale 1-2 puffs into the lungs every 4 (four) hours as needed for wheezing or shortness of breath. 09/22/21   Valentina Shaggy, MD  beclomethasone (QVAR) 40 MCG/ACT inhaler Inhale 1 puff into the lungs as needed.    [provider]  betamethasone valerate ointment (VALISONE) 0.1 % Use a pea sized amount topically BID for up to 2 weeks as needed 09/26/21   Salvadore Dom, MD  budesonide (RHINOCORT AQUA) 32 MCG/ACT nasal spray Place 1 spray into both nostrils 2 (two) times daily as needed. 05/15/21   Valentina Shaggy, MD  EPINEPHrine 0.3 mg/0.3 mL IJ SOAJ injection Inject 0.3 mg into the muscle once as needed (allergic reaction).     [provider]  fexofenadine (ALLEGRA) 180 MG tablet Take 1 tablet (180 mg total) by mouth daily. 05/15/21    Valentina Shaggy, MD  loratadine (CLARITIN) 10 MG tablet Take 10 mg by mouth daily as needed for allergies.    [provider]  meloxicam (MOBIC) 7.5 MG tablet Take 1 tablet (7.5 mg total) by mouth daily as needed for pain. 06/19/22   Trula Slade, DPM  molnupiravir EUA (LAGEVRIO) 200 MG CAPS capsule Take 4 capsules by mouth every 12 hours for 5 days. 11/17/21     omeprazole (PRILOSEC OTC) 20 MG tablet Take by mouth.    [provider]  ondansetron (ZOFRAN) 4 MG tablet Take 1 tablet (4 mg total) by mouth every 8 (eight) hours as needed for nausea or vomiting. 05/02/21   Salvadore Dom, MD  rizatriptan (MAXALT-MLT) 10 MG disintegrating tablet Take 1 tablet (10 mg total) by mouth as needed for migraine. May repeat in 2 hours if needed 02/16/22   Penumalli, Earlean Polka, MD  Topiramate ER (TROKENDI XR) 50 MG CP24 Take 50 mg by mouth at bedtime. 02/16/22   Penumalli, Earlean Polka, MD  traZODone (DESYREL) 50 MG tablet TAKE 1 TABLET BY MOUTH AT BEDTIME AS NEEDED FOR SLEEP. 11/19/21   Gregor Hams, MD  triamcinolone cream (KENALOG) 0.1 % Apply topically. 08/07/21   [provider]      Allergies    Other, Other, Shellfish allergy, Carrot [daucus carota], and Peanut (diagnostic)    Review of Systems   Review of Systems  Respiratory:  Positive for shortness of breath.     Physical Exam Updated Vital Signs BP 122/73   Pulse 76   Temp 98.3 F (36.8 C)   Resp 16   Ht '5\' 8"'$  (1.727 m)   Wt 81.4 kg   LMP 06/18/2022   SpO2 100%   BMI 27.29 kg/m  Physical Exam Vitals and nursing note reviewed. Exam conducted with a chaperone present.  Constitutional:      Appearance: Normal appearance.  HENT:     Head: Normocephalic and atraumatic.     Mouth/Throat:     Comments: Handling secretions, no voice change.  Slight uvular edema but it is midline Eyes:     General: No scleral icterus.       Right eye: No discharge.        Left eye: No discharge.     Extraocular  Movements: Extraocular movements intact.     Pupils: Pupils are equal, round, and reactive to light.  Cardiovascular:     Rate and Rhythm: Normal rate and regular rhythm.     Pulses: Normal pulses.     Heart sounds: Normal heart sounds. No murmur heard.    No friction rub. No gallop.  Pulmonary:     Effort: Pulmonary effort is normal. No respiratory distress.     Breath sounds: Normal breath sounds.     Comments: No stridor Abdominal:     General: Abdomen is flat. Bowel sounds are normal. There is no distension.     Palpations: Abdomen is soft.     Tenderness: There is no abdominal tenderness.  Skin:    General: Skin is warm and dry.     Coloration: Skin is not jaundiced.     Findings: No rash.  Neurological:     Mental Status: She is alert. Mental status is at baseline.     Coordination: Coordination normal.     ED Results / Procedures / Treatments   Labs (all labs ordered are listed, but only abnormal results are displayed) Labs Reviewed  PREGNANCY, URINE    EKG None  Radiology No results found.  Procedures Procedures    Medications Ordered in ED Medications  diphenhydrAMINE (BENADRYL) injection 25 mg (25 mg Intravenous Given 06/19/22 1641)  methylPREDNISolone sodium succinate (SOLU-MEDROL) 125 mg/2 mL injection 125 mg (125 mg Intravenous Given 06/19/22 1641)  famotidine (PEPCID) IVPB 20 mg premix (0 mg Intravenous Stopped 06/19/22 1710)  EPINEPHrine (ADRENALIN) 0.3 mg (0.3 mg Intramuscular Given 06/19/22 1719)    ED Course/ Medical Decision Making/ A&P                           Medical Decision Making Amount and/or Complexity of Data Reviewed Labs: ordered.  Risk Prescription drug management.   Patient presents due to bee sting/allergic reaction.  Differential includes but not limited to allergic reaction, anaphylaxis.  Patient is accompanied by her mother.  On exam she has subjective feeling of tingling in throat and there is some uvular edema.  She is  maintaining her airway and handling her secretions, speaking complete sentences.   I do not see tube organ system involvement that would be consistent with anaphylaxis.  I saw this patient in triage and requested to be put in room with cardiac monitoring and may administer steroids, Benadryl and Pepcid.  We will observe and reevaluate before administering epinephrine.  I reevaluated patient when she was put in a room 10 to 15 minutes after  initiation of medicine for allergic response.  Patient feels like the throat is closing up on her and the swelling is getting worse.  She is having nausea but no vomiting but states she feels very sick and dizzy.  I discussed with my attending who agrees with administering epinephrine and continuing to observe.  Discussed risk and benefits with patient and her mother and they verbalized understanding and would like to proceed.  Epinephrine administered, patient on cardiac monitoring in sinus rhythm.  POC urine obtained which is negative.  Patient was observed on cardiac monitoring for over 4 hours without any deterioration of arrhythmia.  The feeling of swelling and tickling in her throat has completely resolved.  Not having any abdominal pain, nausea, vomiting, new symptoms.  Resting comfortably and there is been no changes in her skin exam specifically no hives.  At this point I think patient is stabilized and appropriate for outpatient follow-up as needed.  I have refilled her epinephrine pen and will send home on a short steroid burst.  Patient is stable for discharge and outpatient follow-up        Final Clinical Impression(s) / ED Diagnoses Final diagnoses:  Allergic reaction, initial encounter    Rx / DC Orders ED Discharge Orders     None         Sherrill Raring, Hershal Coria 06/19/22 2019    Lucrezia Starch, MD 06/20/22 2110

## 2022-06-19 NOTE — Patient Instructions (Signed)
Meloxicam Tablets What is this medication? MELOXICAM (mel OX i cam) treats mild to moderate pain, inflammation, or arthritis. It works by decreasing inflammation. It belongs to a group of medications called NSAIDs. This medicine may be used for other purposes; ask your health care provider or pharmacist if you have questions. COMMON BRAND NAME(S): Mobic What should I tell my care team before I take this medication? They need to know if you have any of these conditions: Asthma (lung or breathing disease) Bleeding disorder Coronary artery bypass graft (CABG) within the past 2 weeks Dehydration Heart attack Heart disease Heart failure High blood pressure If you often drink alcohol Kidney disease Liver disease Smoke tobacco cigarettes Stomach bleeding Stomach ulcers, other stomach or intestine problems Take medications that treat or prevent blood clots Taking other steroids like dexamethasone or prednisone An unusual or allergic reaction to meloxicam, other medications, foods, dyes, or preservatives Pregnant or trying to get pregnant Breast-feeding How should I use this medication? Take this medication by mouth. Take it as directed on the prescription label at the same time every day. You can take it with or without food. If it upsets your stomach, take it with food. Do not use it more often than directed. There may be unused or extra doses in the bottle after you finish your treatment. Talk to your care team if you have questions about your dose. A special MedGuide will be given to you by the pharmacist with each prescription and refill. Be sure to read this information carefully each time. Talk to your care team about the use of this medication in children. Special care may be needed. Patients over 27 years of age may have a stronger reaction and need a smaller dose. Overdosage: If you think you have taken too much of this medicine contact a poison control center or emergency room at  once. NOTE: This medicine is only for you. Do not share this medicine with others. What if I miss a dose? If you miss a dose, take it as soon as you can. If it is almost time for your next dose, take only that dose. Do not take double or extra doses. What may interact with this medication? Do not take this medication with any of the following: Cidofovir Ketorolac This medication may also interact with the following: Aspirin and aspirin-like medications Certain medications for blood pressure, heart disease, irregular heart beat Certain medications for depression, anxiety, or psychotic disturbances Certain medications that treat or prevent blood clots like warfarin, enoxaparin, dalteparin, apixaban, dabigatran, rivaroxaban Cyclosporine Diuretics Fluconazole Lithium Methotrexate Other NSAIDs, medications for pain and inflammation, like ibuprofen and naproxen Pemetrexed This list may not describe all possible interactions. Give your health care provider a list of all the medicines, herbs, non-prescription drugs, or dietary supplements you use. Also tell them if you smoke, drink alcohol, or use illegal drugs. Some items may interact with your medicine. What should I watch for while using this medication? Visit your care team for regular checks on your progress. Tell your care team if your symptoms do not start to get better or if they get worse. Do not take other medications that contain aspirin, ibuprofen, or naproxen with this medication. Side effects such as stomach upset, nausea, or ulcers may be more likely to occur. Many non-prescription medications contain aspirin, ibuprofen, or naproxen. Always read labels carefully. This medication can cause serious ulcers and bleeding in the stomach. It can happen with no warning. Smoking, drinking alcohol, older age, and  poor health can also increase risks. Call your care team right away if you have stomach pain or blood in your vomit or stool. This  medication does not prevent a heart attack or stroke. This medication may increase the chance of a heart attack or stroke. The chance may increase the longer you use this medication or if you have heart disease. If you take aspirin to prevent a heart attack or stroke, talk to your care team about using this medication. Alcohol may interfere with the effect of this medication. Avoid alcoholic drinks. This medication may cause serious skin reactions. They can happen weeks to months after starting the medication. Contact your care team right away if you notice fevers or flu-like symptoms with a rash. The rash may be red or purple and then turn into blisters or peeling of the skin. Or, you might notice a red rash with swelling of the face, lips or lymph nodes in your neck or under your arms. Talk to your care team if you are pregnant before taking this medication. Taking this medication between weeks 20 and 30 of pregnancy may harm your unborn baby. Your care team will monitor you closely if you need to take it. After 30 weeks of pregnancy, do not take this medication. You may get drowsy or dizzy. Do not drive, use machinery, or do anything that needs mental alertness until you know how this medication affects you. Do not stand up or sit up quickly, especially if you are an older patient. This reduces the risk of dizzy or fainting spells. Be careful brushing or flossing your teeth or using a toothpick because you may get an infection or bleed more easily. If you have any dental work done, tell your dentist you are receiving this medication. This medication may make it more difficult to get pregnant. Talk to your care team if you are concerned about your fertility. What side effects may I notice from receiving this medication? Side effects that you should report to your care team as soon as possible: Allergic reactions--skin rash, itching, hives, swelling of the face, lips, tongue, or throat Bleeding--bloody or  black, tar-like stools, vomiting blood or brown material that looks like coffee grounds, red or dark brown urine, small red or purple spots on skin, unusual bruising or bleeding Heart attack--pain or tightness in the chest, shoulders, arms, or jaw, nausea, shortness of breath, cold or clammy skin, feeling faint or lightheaded Heart failure--shortness of breath, swelling of ankles, feet, or hands, sudden weight gain, unusual weakness or fatigue Increase in blood pressure Kidney injury--decrease in the amount of urine, swelling of the ankles, hands, or feet Liver injury--right upper belly pain, loss of appetite, nausea, light-colored stool, dark yellow or brown urine, yellowing skin or eyes, unusual weakness or fatigue Rash, fever, and swollen lymph nodes Redness, blistering, peeling, or loosening of the skin, including inside the mouth Stroke--sudden numbness or weakness of the face, arm, or leg, trouble speaking, confusion, trouble walking, loss of balance or coordination, dizziness, severe headache, change in vision Side effects that usually do not require medical attention (report to your care team if they continue or are bothersome): Diarrhea Nausea Upset stomach This list may not describe all possible side effects. Call your doctor for medical advice about side effects. You may report side effects to FDA at 1-800-FDA-1088. Where should I keep my medication? Keep out of the reach of children and pets. Store at room temperature between 20 and 25 degrees C (68 and  77 degrees F). Protect from moisture. Keep the container tightly closed. Get rid of any unused medication after the expiration date. To get rid of medications that are no longer needed or have expired: Take the medication to a medication take-back program. Check with your pharmacy or law enforcement to find a location. If you cannot return the medication, check the label or package insert to see if the medication should be thrown out  in the garbage or flushed down the toilet. If you are not sure, ask your care team. If it is safe to put it in the trash, empty the medication out of the container. Mix the medication with cat litter, dirt, coffee grounds, or other unwanted substance. Seal the mixture in a bag or container. Put it in the trash. NOTE: This sheet is a summary. It may not cover all possible information. If you have questions about this medicine, talk to your doctor, pharmacist, or health care provider.  2023 Elsevier/Gold Standard (2021-03-05 00:00:00) Posterior Tibial Tendon Tear Rehab Ask your health care provider which exercises are safe for you. Do exercises exactly as told by your health care provider and adjust them as directed. It is normal to feel mild stretching, pulling, tightness, or discomfort as you do these exercises. Stop right away if you feel sudden pain or your pain gets worse. Do not begin these exercises until told by your health care provider. Stretching and range-of-motion exercises These exercises warm up your muscles and joints and improve the movement and flexibility of your ankle. These exercises also help to relieve pain, numbness, and tingling. Gastroc stretch  Sit on the floor with your left / right leg extended. Loop a belt or towel around ball of your left / right foot. The ball of your foot is on the walking surface, right under your toes. Keep your left / right ankle and foot relaxed and keep your knee straight while you use the belt or towel to pull your foot and ankle toward you. You should feel a gentle stretch behind your calf or knee (gastrocnemius). Hold this position for __________ seconds. Repeat __________ times. Complete this exercise __________ times a day. Active ankle dorsiflexion and plantar flexion  Sit with your left / right knee straight or bent. Do not rest your foot on anything. Flex your left / right ankle to tilt the top of your foot toward your shin  (dorsiflexion). Hold this position for __________ seconds. Point your toes downward to tilt the top of your foot away from your shin (plantar flexion). Hold this position for __________ seconds. Repeat __________ times with your knee straight and __________ times with your knee bent. Complete this exercise __________ times a day. Passive ankle plantar flexion  Sit with your left / right leg crossed over your opposite knee. With your left / right hand, pull the front of your foot and toes toward you (plantar flexion). You should feel a gentle stretch on the top of your foot and ankle. Hold this position for __________ seconds. Repeat __________ times. Complete this exercise __________ times a day. Passive ankle eversion  Sit with your left / right ankle crossed over your opposite knee. With your left / right hand, hold your foot so that your thumb is on the top of your foot and your fingers are on the bottom of your foot. Gently push and twist your ankle downward (eversion) so the smallest toes rise slightly toward the ceiling. You should feel a gentle stretch on the inside  of your ankle. Hold this stretch for __________ seconds. Repeat __________ times. Complete this exercise __________ times a day. Passive ankle inversion  Sit with your left / right ankle crossed over your opposite knee. With your left / right hand, hold your foot so that your thumb is on the bottom of your foot and your fingers are across the top of your foot. Gently pull and twist your foot so the smallest toe comes toward you (inversion). You should feel a gentle stretch on the outside of your ankle. Hold the stretch for __________ seconds. Repeat __________ times. Complete this exercise __________ times a day. Ankle alphabet  Sit with your left / right leg supported at the lower leg. Do not rest your foot on anything. Make sure your foot has room to move freely. Think of your left / right foot as a paintbrush, and  move your foot to trace each letter of the alphabet in the air. Keep your hip and knee still while you trace. Trace every letter of the alphabet. Repeat __________ times. Complete this exercise __________ times a day. Strengthening exercises These exercises build strength and endurance in your lower leg. Endurance is the ability to use your muscles for a long time, even after they get tired. Dorsiflexion  Secure a rubber exercise band or tube to an object that will not move if it is pulled on, such as a table leg. Secure the other end of the band around your left / right foot. Sit on the floor, facing the object with your left / right leg extended. The band or tube should be slightly tense when your foot is relaxed. Slowly flex your left / right ankle and toes to bring your foot toward you (dorsiflexion). Hold this position for __________ seconds. Let the band or tube slowly pull your foot back to the starting position. Repeat __________ times. Complete this exercise __________ times a day. Plantar flexion while seated  Sit on the floor with your left / right leg extended. Loop a rubber exercise band or tube around the ball of your left / right foot. The ball of your foot is on the walking surface, right under your toes. The band or tube should be slightly tense when your foot is relaxed. Slowly point your toes downward, pushing them away from you (plantar flexion). Hold this position for __________ seconds. Let the band or tube slowly pull your foot back to the starting position. Repeat __________ times. Complete this exercise __________ times a day. Towel curls  Sit in a chair on a non-carpeted surface, and put your feet on the floor. Place a towel in front of your feet. If told by your health care provider, add __________ to the end of the towel. Keeping your heel on the floor, put your left / right foot on the towel. Pull the towel toward you by grabbing the towel with your toes and  curling them under. Keep your heel on the floor. Repeat __________ times. Complete this exercise __________ times a day. This information is not intended to replace advice given to you by your health care provider. Make sure you discuss any questions you have with your health care provider. Document Revised: 03/31/2019 Document Reviewed: 01/25/2019 Elsevier Patient Education  [place].

## 2022-06-19 NOTE — ED Notes (Signed)
Patient given discharge instructions, all questions answered. Patient in possession of all belongings, directed to the discharge area  

## 2022-06-19 NOTE — Discharge Instructions (Addendum)
Take 40 mg of prednisone daily for the next 5 days.  Use the epinephrine pen as needed for severe allergic reaction that is a threat to life, if you use an epinephrine pen please know you need to call an ambulance and come and be seen in the hospital immediately.  Return to the ED if you have new or worsening symptoms, the sore throat should improve with the steroids over time.

## 2022-06-19 NOTE — ED Triage Notes (Signed)
Patient arrives with complaints of shortness of breath after being stung by a bee ~45 minutes pta. Patient reports shortness of breath and throat tickling.   Renette Butters. PA to triage to assess. Mother with patient

## 2022-07-10 ENCOUNTER — Ambulatory Visit: Payer: Managed Care, Other (non HMO) | Admitting: Podiatry

## 2022-07-16 ENCOUNTER — Other Ambulatory Visit: Payer: Self-pay | Admitting: Podiatry

## 2022-07-31 ENCOUNTER — Ambulatory Visit: Payer: Managed Care, Other (non HMO) | Admitting: Podiatry

## 2022-08-14 ENCOUNTER — Ambulatory Visit (INDEPENDENT_AMBULATORY_CARE_PROVIDER_SITE_OTHER): Payer: Managed Care, Other (non HMO) | Admitting: Podiatry

## 2022-08-14 DIAGNOSIS — M7751 Other enthesopathy of right foot: Secondary | ICD-10-CM | POA: Diagnosis not present

## 2022-08-14 DIAGNOSIS — M1990 Unspecified osteoarthritis, unspecified site: Secondary | ICD-10-CM | POA: Diagnosis not present

## 2022-08-14 DIAGNOSIS — M7752 Other enthesopathy of left foot: Secondary | ICD-10-CM | POA: Diagnosis not present

## 2022-08-14 MED ORDER — MELOXICAM 7.5 MG PO TABS: 7.5000 mg | ORAL_TABLET | Freq: Every day | ORAL | 0 refills | Status: DC | PRN

## 2022-08-14 NOTE — Progress Notes (Unsigned)
Tiny bit better Crawling down steps so doesn'f fa Intermittent swelling Left lateral right medial

## 2022-08-25 LAB — ANTI-CCP AB, IGG + IGA (RDL): Anti-CCP Ab, IgG + IgA (RDL): <20 Units (ref <20)

## 2022-08-25 LAB — ANA: Anti Nuclear Antibody (ANA): NEGATIVE

## 2022-08-25 LAB — SEDIMENTATION RATE: Sed Rate: 8 mm/hr (ref 0–32)

## 2022-08-25 LAB — C-REACTIVE PROTEIN: CRP: <1 mg/L (ref 0–10)

## 2022-08-25 LAB — RHEUMATOID FACTOR: Rheumatoid fact SerPl-aCnc: <10 IU/mL (ref <14.0)

## 2022-09-15 ENCOUNTER — Other Ambulatory Visit: Payer: Self-pay

## 2022-09-23 ENCOUNTER — Other Ambulatory Visit: Payer: Self-pay | Admitting: Podiatry

## 2022-09-23 DIAGNOSIS — M7752 Other enthesopathy of left foot: Secondary | ICD-10-CM

## 2022-09-23 DIAGNOSIS — M7751 Other enthesopathy of right foot: Secondary | ICD-10-CM

## 2022-09-23 NOTE — Progress Notes (Signed)
Due to patient still having pain, MRI of b/l ankles ordered

## 2022-10-01 ENCOUNTER — Ambulatory Visit: Payer: Managed Care, Other (non HMO) | Admitting: Podiatry

## 2022-10-03 ENCOUNTER — Emergency Department (HOSPITAL_BASED_OUTPATIENT_CLINIC_OR_DEPARTMENT_OTHER)
Admission: EM | Admit: 2022-10-03 | Discharge: 2022-10-03 | Disposition: A | Discharge status: Home / Self Care | Payer: Managed Care, Other (non HMO) | Attending: Emergency Medicine | Admitting: Emergency Medicine

## 2022-10-03 ENCOUNTER — Other Ambulatory Visit: Payer: Self-pay

## 2022-10-03 ENCOUNTER — Emergency Department (HOSPITAL_BASED_OUTPATIENT_CLINIC_OR_DEPARTMENT_OTHER): Payer: Managed Care, Other (non HMO) | Admitting: Radiology

## 2022-10-03 DIAGNOSIS — Z1152 Encounter for screening for COVID-19: Secondary | ICD-10-CM | POA: Diagnosis not present

## 2022-10-03 DIAGNOSIS — Z91010 Allergy to peanuts: Secondary | ICD-10-CM | POA: Diagnosis not present

## 2022-10-03 DIAGNOSIS — R0981 Nasal congestion: Secondary | ICD-10-CM | POA: Diagnosis present

## 2022-10-03 DIAGNOSIS — B349 Viral infection, unspecified: Secondary | ICD-10-CM | POA: Diagnosis not present

## 2022-10-03 DIAGNOSIS — E876 Hypokalemia: Secondary | ICD-10-CM | POA: Insufficient documentation

## 2022-10-03 LAB — COMPREHENSIVE METABOLIC PANEL
ALT: 7 U/L (ref 0–44)
AST: 12 U/L — ABNORMAL LOW (ref 15–41)
Albumin: 4.7 g/dL (ref 3.5–5.0)
Alkaline Phosphatase: 53 U/L (ref 38–126)
Anion gap: 14 (ref 5–15)
BUN: 9 mg/dL (ref 6–20)
CO2: 25 mmol/L (ref 22–32)
Calcium: 9.7 mg/dL (ref 8.9–10.3)
Chloride: 102 mmol/L (ref 98–111)
Creatinine, Ser: 0.66 mg/dL (ref 0.44–1.00)
GFR, Estimated: >60 mL/min (ref >60)
Glucose, Bld: 84 mg/dL (ref 70–99)
Potassium: 2.7 mmol/L — CL (ref 3.5–5.1)
Sodium: 141 mmol/L (ref 135–145)
Total Bilirubin: 0.5 mg/dL (ref 0.3–1.2)
Total Protein: 8 g/dL (ref 6.5–8.1)

## 2022-10-03 LAB — RESP PANEL BY RT-PCR (FLU A&B, COVID) ARPGX2
Influenza A by PCR: NEGATIVE
Influenza B by PCR: NEGATIVE
SARS Coronavirus 2 by RT PCR: NEGATIVE

## 2022-10-03 LAB — CBC WITH DIFFERENTIAL/PLATELET
Abs Immature Granulocytes: 0.03 10*3/uL (ref 0.00–0.07)
Basophils Absolute: 0.1 10*3/uL (ref 0.0–0.1)
Basophils Relative: 0 %
Eosinophils Absolute: 0.5 10*3/uL (ref 0.0–0.5)
Eosinophils Relative: 4 %
HCT: 37.2 % (ref 36.0–46.0)
Hemoglobin: 12.1 g/dL (ref 12.0–15.0)
Immature Granulocytes: 0 %
Lymphocytes Relative: 25 %
Lymphs Abs: 2.9 10*3/uL (ref 0.7–4.0)
MCH: 28.7 pg (ref 26.0–34.0)
MCHC: 32.5 g/dL (ref 30.0–36.0)
MCV: 88.4 fL (ref 80.0–100.0)
Monocytes Absolute: 0.6 10*3/uL (ref 0.1–1.0)
Monocytes Relative: 5 %
Neutro Abs: 7.6 10*3/uL (ref 1.7–7.7)
Neutrophils Relative %: 66 %
Platelets: 263 10*3/uL (ref 150–400)
RBC: 4.21 MIL/uL (ref 3.87–5.11)
RDW: 13.1 % (ref 11.5–15.5)
WBC: 11.6 10*3/uL — ABNORMAL HIGH (ref 4.0–10.5)
nRBC: 0 % (ref 0.0–0.2)

## 2022-10-03 LAB — GROUP A STREP BY PCR: Group A Strep by PCR: NOT DETECTED

## 2022-10-03 MED ORDER — ALBUTEROL SULFATE (2.5 MG/3ML) 0.083% IN NEBU
2.5000 mg | INHALATION_SOLUTION | Freq: Once | RESPIRATORY_TRACT | Status: AC
  Administered 2022-10-03: 2.5 mg via RESPIRATORY_TRACT
  Filled 2022-10-03: qty 3

## 2022-10-03 MED ORDER — ACETAMINOPHEN 325 MG PO TABS
650.0000 mg | ORAL_TABLET | Freq: Once | ORAL | Status: DC | PRN
  Filled 2022-10-03: qty 2

## 2022-10-03 MED ORDER — MAGNESIUM SULFATE 2 GM/50ML IV SOLN
2.0000 g | Freq: Once | INTRAVENOUS | Status: AC
Start: 2022-10-03 — End: 2022-10-03
  Administered 2022-10-03: 2 g via INTRAVENOUS
  Filled 2022-10-03: qty 50

## 2022-10-03 MED ORDER — POTASSIUM CHLORIDE CRYS ER 20 MEQ PO TBCR
60.0000 meq | EXTENDED_RELEASE_TABLET | Freq: Once | ORAL | Status: AC
  Administered 2022-10-03: 60 meq via ORAL
  Filled 2022-10-03: qty 3

## 2022-10-03 MED ORDER — ALBUTEROL SULFATE HFA 108 (90 BASE) MCG/ACT IN AERS: 1.0000 | INHALATION_SPRAY | RESPIRATORY_TRACT | 0 refills | Status: DC | PRN

## 2022-10-03 MED ORDER — POTASSIUM CHLORIDE 10 MEQ/100ML IV SOLN
10.0000 meq | Freq: Once | INTRAVENOUS | Status: AC
  Administered 2022-10-03: 10 meq via INTRAVENOUS
  Filled 2022-10-03: qty 100

## 2022-10-03 NOTE — ED Notes (Signed)
CRITICAL VALUE STICKER  CRITICAL VALUE:potassium 2.7  RECEIVER (on-site recipient of call):Gagandeep Kossman  DATE & TIME NOTIFIED: 10/03/2022 now  MESSENGER (representative from lab):  MD NOTIFIED: [email or web address]  TIME OF NOTIFICATION: now RESPONSE:

## 2022-10-03 NOTE — Discharge Instructions (Addendum)
You were seen in the ER for evaluation of your cough and cold symptoms.  You tested negative for COVID, flu, and strep.  Chest Xray was unremarkable.  You did however show that you had a low potassium.  We were able to replenish this and your magnesium while here.  I would like for you to follow-up with your PCP in the next few days to have this rechecked.  I have refilled your albuterol inhaler to use for coughing.  I would continue taking the azithromycin your previously prescribed.  You can try the Nettie pot as previously mentioned, just make sure you are using only DISTILLED water.  Otherwise, I would ask her pharmacist about cough or cold medication you can take given your fruit allergy.  If you have any concerns, new or worsening symptoms, please return to the nearest emergency department for reevaluation.  Contact a health care provider if: You have symptoms of a viral illness that do not go away. Your symptoms come back after going away. Your symptoms get worse. Get help right away if you have: Trouble breathing. A severe headache or a stiff neck. Severe vomiting or pain in your abdomen. These symptoms may represent a serious problem that is an emergency. Do not wait to see if the symptoms will go away. Get medical help right away. Call your local emergency services (911 in the [place] Do not drive yourself to the hospital.

## 2022-10-03 NOTE — ED Triage Notes (Signed)
Patient arrives POV with complaints of headache, cough, nasal congestion, x2 days. Patient was prescribed Azithromycin by her PCP, currently on day 2.  Rates headache pain a 7/10.

## 2022-10-03 NOTE — ED Provider Notes (Signed)
[facility] Provider Note   CSN: [id number] Arrival date & time: 10/03/22  1255     History {Add pertinent medical, surgical, social history, OB history to HPI:1} Chief Complaint  Patient presents with   Nasal Congestion   Cough    Kari Carrillo is a 21 y.o. female.   Cough      Home Medications Prior to Admission medications   Medication Sig Start Date End Date Taking? Authorizing Provider  albuterol (VENTOLIN HFA) 108 (90 Base) MCG/ACT inhaler Inhale 1-2 puffs into the lungs every 4 (four) hours as needed for wheezing or shortness of breath. 09/22/21   Valentina Shaggy, MD  beclomethasone (QVAR) 40 MCG/ACT inhaler Inhale 1 puff into the lungs as needed.    [provider]  betamethasone valerate ointment (VALISONE) 0.1 % Use a pea sized amount topically BID for up to 2 weeks as needed 09/26/21   Salvadore Dom, MD  budesonide (RHINOCORT AQUA) 32 MCG/ACT nasal spray Place 1 spray into both nostrils 2 (two) times daily as needed. 05/15/21   Valentina Shaggy, MD  EPINEPHrine 0.3 mg/0.3 mL IJ SOAJ injection Inject 0.3 mg into the muscle as needed for anaphylaxis. 06/19/22   Sherrill Raring, PA-C  fexofenadine (ALLEGRA) 180 MG tablet Take 1 tablet (180 mg total) by mouth daily. 05/15/21   Valentina Shaggy, MD  loratadine (CLARITIN) 10 MG tablet Take 10 mg by mouth daily as needed for allergies.    [provider]  meloxicam (MOBIC) 7.5 MG tablet Take 1 tablet (7.5 mg total) by mouth daily as needed for pain. 08/14/22   Trula Slade, DPM  molnupiravir EUA (LAGEVRIO) 200 MG CAPS capsule Take 4 capsules by mouth every 12 hours for 5 days. 11/17/21     omeprazole (PRILOSEC OTC) 20 MG tablet Take by mouth.    [provider]  ondansetron (ZOFRAN) 4 MG tablet Take 1 tablet (4 mg total) by mouth every 8 (eight) hours as needed for nausea or vomiting. 05/02/21   Salvadore Dom, MD  rizatriptan (MAXALT-MLT) 10  MG disintegrating tablet Take 1 tablet (10 mg total) by mouth as needed for migraine. May repeat in 2 hours if needed 02/16/22   Penumalli, Earlean Polka, MD  Topiramate ER (TROKENDI XR) 50 MG CP24 Take 50 mg by mouth at bedtime. 02/16/22   Penumalli, Earlean Polka, MD  traZODone (DESYREL) 50 MG tablet TAKE 1 TABLET BY MOUTH AT BEDTIME AS NEEDED FOR SLEEP. 11/19/21   Gregor Hams, MD  triamcinolone cream (KENALOG) 0.1 % Apply topically. 08/07/21   [provider]      Allergies    Other, Other, Shellfish allergy, Carrot [daucus carota], and Peanut (diagnostic)    Review of Systems   Review of Systems  Respiratory:  Positive for cough.     Physical Exam Updated Vital Signs BP 122/79 (BP Location: Right Arm)   Pulse 89   Temp 98.4 F (36.9 C) (Oral)   Resp 18   Ht '5\' 8"'$  (1.727 m)   Wt 81.6 kg   LMP 09/29/2022   SpO2 99%   BMI 27.37 kg/m  Physical Exam  ED Results / Procedures / Treatments   Labs (all labs ordered are listed, but only abnormal results are displayed) Labs Reviewed  CBC WITH DIFFERENTIAL/PLATELET - Abnormal; Notable for the following components:      Result Value   WBC 11.6 (*)    All other components within normal limits  COMPREHENSIVE METABOLIC  PANEL - Abnormal; Notable for the following components:   Potassium 2.7 (*)    AST 12 (*)    All other components within normal limits  RESP PANEL BY RT-PCR (FLU A&B, COVID) ARPGX2  GROUP A STREP BY PCR    EKG None  Radiology DG Chest 2 View  Result Date: 10/03/2022 CLINICAL DATA:  Cough EXAM: CHEST - 2 VIEW COMPARISON:  Chest radiograph dated November 09, 2021 FINDINGS: The heart size and mediastinal contours are within normal limits. Both lungs are clear. The visualized skeletal structures are unremarkable. IMPRESSION: No active cardiopulmonary disease. Electronically Signed   By: Keane Police D.O.   On: 10/03/2022 15:43    Procedures Procedures  {Document cardiac monitor, telemetry assessment procedure when  appropriate:1}  Medications Ordered in ED Medications  acetaminophen (TYLENOL) tablet 650 mg (has no administration in time range)  albuterol (PROVENTIL) (2.5 MG/3ML) 0.083% nebulizer solution 2.5 mg (2.5 mg Nebulization Given 10/03/22 1444)    ED Course/ Medical Decision Making/ A&P                           Medical Decision Making Amount and/or Complexity of Data Reviewed Labs: ordered. Radiology: ordered.  Risk OTC drugs. Prescription drug management.   ***  Headache is gone after Mg and K.  {Document critical care time when appropriate:1} {Document review of labs and clinical decision tools ie heart score, Chads2Vasc2 etc:1}  {Document your independent review of radiology images, and any outside records:1} {Document your discussion with family members, caretakers, and with consultants:1} {Document social determinants of health affecting pt's care:1} {Document your decision making why or why not admission, treatments were needed:1} Final Clinical Impression(s) / ED Diagnoses Final diagnoses:  None    Rx / DC Orders ED Discharge Orders     None

## 2022-10-06 ENCOUNTER — Other Ambulatory Visit (HOSPITAL_BASED_OUTPATIENT_CLINIC_OR_DEPARTMENT_OTHER): Payer: Self-pay

## 2022-10-06 ENCOUNTER — Encounter (HOSPITAL_BASED_OUTPATIENT_CLINIC_OR_DEPARTMENT_OTHER): Payer: Self-pay

## 2022-10-06 ENCOUNTER — Emergency Department (HOSPITAL_BASED_OUTPATIENT_CLINIC_OR_DEPARTMENT_OTHER): Payer: Managed Care, Other (non HMO) | Admitting: Radiology

## 2022-10-06 ENCOUNTER — Emergency Department (HOSPITAL_BASED_OUTPATIENT_CLINIC_OR_DEPARTMENT_OTHER)
Admission: EM | Admit: 2022-10-06 | Discharge: 2022-10-06 | Disposition: A | Discharge status: Home / Self Care | Payer: Managed Care, Other (non HMO) | Attending: Emergency Medicine | Admitting: Emergency Medicine

## 2022-10-06 ENCOUNTER — Other Ambulatory Visit: Payer: Self-pay

## 2022-10-06 DIAGNOSIS — R630 Anorexia: Secondary | ICD-10-CM | POA: Diagnosis not present

## 2022-10-06 DIAGNOSIS — R0602 Shortness of breath: Secondary | ICD-10-CM | POA: Insufficient documentation

## 2022-10-06 DIAGNOSIS — Z20822 Contact with and (suspected) exposure to covid-19: Secondary | ICD-10-CM | POA: Insufficient documentation

## 2022-10-06 DIAGNOSIS — R051 Acute cough: Secondary | ICD-10-CM | POA: Diagnosis not present

## 2022-10-06 DIAGNOSIS — D72829 Elevated white blood cell count, unspecified: Secondary | ICD-10-CM | POA: Insufficient documentation

## 2022-10-06 DIAGNOSIS — R091 Pleurisy: Secondary | ICD-10-CM | POA: Insufficient documentation

## 2022-10-06 DIAGNOSIS — Z91010 Allergy to peanuts: Secondary | ICD-10-CM | POA: Diagnosis not present

## 2022-10-06 DIAGNOSIS — R0981 Nasal congestion: Secondary | ICD-10-CM | POA: Diagnosis not present

## 2022-10-06 DIAGNOSIS — J3489 Other specified disorders of nose and nasal sinuses: Secondary | ICD-10-CM | POA: Diagnosis not present

## 2022-10-06 DIAGNOSIS — R059 Cough, unspecified: Secondary | ICD-10-CM | POA: Diagnosis present

## 2022-10-06 LAB — CBC WITH DIFFERENTIAL/PLATELET
Abs Immature Granulocytes: 0.07 10*3/uL (ref 0.00–0.07)
Basophils Absolute: 0.1 10*3/uL (ref 0.0–0.1)
Basophils Relative: 1 %
Eosinophils Absolute: 0.9 10*3/uL — ABNORMAL HIGH (ref 0.0–0.5)
Eosinophils Relative: 5 %
HCT: 33.4 % — ABNORMAL LOW (ref 36.0–46.0)
Hemoglobin: 11 g/dL — ABNORMAL LOW (ref 12.0–15.0)
Immature Granulocytes: 0 %
Lymphocytes Relative: 19 %
Lymphs Abs: 3.6 10*3/uL (ref 0.7–4.0)
MCH: 29.1 pg (ref 26.0–34.0)
MCHC: 32.9 g/dL (ref 30.0–36.0)
MCV: 88.4 fL (ref 80.0–100.0)
Monocytes Absolute: 0.8 10*3/uL (ref 0.1–1.0)
Monocytes Relative: 4 %
Neutro Abs: 13.3 10*3/uL — ABNORMAL HIGH (ref 1.7–7.7)
Neutrophils Relative %: 71 %
Platelets: 286 10*3/uL (ref 150–400)
RBC: 3.78 MIL/uL — ABNORMAL LOW (ref 3.87–5.11)
RDW: 13.1 % (ref 11.5–15.5)
WBC: 18.8 10*3/uL — ABNORMAL HIGH (ref 4.0–10.5)
nRBC: 0 % (ref 0.0–0.2)

## 2022-10-06 LAB — URINALYSIS, ROUTINE W REFLEX MICROSCOPIC
Bilirubin Urine: NEGATIVE
Glucose, UA: NEGATIVE mg/dL
Hgb urine dipstick: NEGATIVE
Ketones, ur: NEGATIVE mg/dL
Leukocytes,Ua: NEGATIVE
Nitrite: NEGATIVE
Protein, ur: NEGATIVE mg/dL
Specific Gravity, Urine: 1.009 (ref 1.005–1.030)
pH: 5.5 (ref 5.0–8.0)

## 2022-10-06 LAB — COMPREHENSIVE METABOLIC PANEL
ALT: 10 U/L (ref 0–44)
AST: 12 U/L — ABNORMAL LOW (ref 15–41)
Albumin: 4.5 g/dL (ref 3.5–5.0)
Alkaline Phosphatase: 62 U/L (ref 38–126)
Anion gap: 10 (ref 5–15)
BUN: 9 mg/dL (ref 6–20)
CO2: 27 mmol/L (ref 22–32)
Calcium: 9.4 mg/dL (ref 8.9–10.3)
Chloride: 103 mmol/L (ref 98–111)
Creatinine, Ser: 0.58 mg/dL (ref 0.44–1.00)
GFR, Estimated: >60 mL/min (ref >60)
Glucose, Bld: 84 mg/dL (ref 70–99)
Potassium: 3.7 mmol/L (ref 3.5–5.1)
Sodium: 140 mmol/L (ref 135–145)
Total Bilirubin: 0.3 mg/dL (ref 0.3–1.2)
Total Protein: 7.5 g/dL (ref 6.5–8.1)

## 2022-10-06 LAB — MAGNESIUM: Magnesium: 2 mg/dL (ref 1.7–2.4)

## 2022-10-06 LAB — RESP PANEL BY RT-PCR (FLU A&B, COVID) ARPGX2
Influenza A by PCR: NEGATIVE
Influenza B by PCR: NEGATIVE
SARS Coronavirus 2 by RT PCR: NEGATIVE

## 2022-10-06 LAB — D-DIMER, QUANTITATIVE: D-Dimer, Quant: 0.32 ug/mL-FEU (ref 0.00–0.50)

## 2022-10-06 LAB — CK: Total CK: 140 U/L (ref 38–234)

## 2022-10-06 LAB — PREGNANCY, URINE: Preg Test, Ur: NEGATIVE

## 2022-10-06 LAB — SARS CORONAVIRUS 2 BY RT PCR: SARS Coronavirus 2 by RT PCR: NEGATIVE

## 2022-10-06 MED ORDER — ALBUTEROL SULFATE HFA 108 (90 BASE) MCG/ACT IN AERS
2.0000 | INHALATION_SPRAY | RESPIRATORY_TRACT | Status: DC | PRN
  Administered 2022-10-06: 2 via RESPIRATORY_TRACT
  Filled 2022-10-06: qty 6.7

## 2022-10-06 MED ORDER — SODIUM CHLORIDE 0.9 % IV BOLUS
1000.0000 mL | Freq: Once | INTRAVENOUS | Status: AC
  Administered 2022-10-06: 1000 mL via INTRAVENOUS

## 2022-10-06 MED ORDER — BENZONATATE 100 MG PO CAPS
100.0000 mg | ORAL_CAPSULE | Freq: Three times a day (TID) | ORAL | 0 refills | Status: DC
  Filled 2022-10-06: qty 21, 7d supply, fill #0

## 2022-10-06 MED ORDER — FLUTICASONE PROPIONATE 50 MCG/ACT NA SUSP
2.0000 | Freq: Every day | NASAL | 0 refills | Status: DC
  Filled 2022-10-06: qty 16, 30d supply, fill #0

## 2022-10-06 NOTE — ED Notes (Signed)
Discharge paperwork given and verbally understood. 

## 2022-10-06 NOTE — ED Provider Notes (Signed)
[facility] Provider Note   CSN: [id number] Arrival date & time: 10/06/22  0703    History  Chief Complaint  Patient presents with   URI    Kari Carrillo is a 20 y.o. female here for evaluation of feeling unwell.  Recently returned home from [place] on Thursday the 12th.  Was seen on Friday here in the emergency department was noted to have low potassium, subsequently replaced with p.o. as well as magnesium.  She had a clear chest x-ray.  They did treat her with steroids, azithromycin.  States she continues to feel poorly.  No emesis however overall decreased appetite.  Still has congestion, rhinorrhea.  Now she has some pleuritic chest pain, shortness of breath.  Associated cough.  No hemoptysis.  No lower extremity swelling.  She is generalized myalgias.  Family did have similar symptoms however there is improved and hers did not.  Of note she has chronically low potassium per patient.  HPI     Home Medications Prior to Admission medications   Medication Sig Start Date End Date Taking? Authorizing Provider  benzonatate (TESSALON) 100 MG capsule Take 1 capsule (100 mg total) by mouth every 8 (eight) hours. 10/06/22  Yes Keyly Baldonado A, PA-C  fluticasone (FLONASE) 50 MCG/ACT nasal spray Place 2 sprays into both nostrils daily. 10/06/22  Yes Elson Ulbrich A, PA-C  albuterol (VENTOLIN HFA) 108 (90 Base) MCG/ACT inhaler Inhale 1-2 puffs into the lungs every 4 (four) hours as needed for wheezing or shortness of breath. 10/03/22   Sherrell Puller, PA-C  beclomethasone (QVAR) 40 MCG/ACT inhaler Inhale 1 puff into the lungs as needed.    [provider]  betamethasone valerate ointment (VALISONE) 0.1 % Use a pea sized amount topically BID for up to 2 weeks as needed 09/26/21   Salvadore Dom, MD  budesonide (RHINOCORT AQUA) 32 MCG/ACT nasal spray Place 1 spray into both nostrils 2 (two) times daily as needed. 05/15/21   Valentina Shaggy, MD   EPINEPHrine 0.3 mg/0.3 mL IJ SOAJ injection Inject 0.3 mg into the muscle as needed for anaphylaxis. 06/19/22   Sherrill Raring, PA-C  fexofenadine (ALLEGRA) 180 MG tablet Take 1 tablet (180 mg total) by mouth daily. 05/15/21   Valentina Shaggy, MD  loratadine (CLARITIN) 10 MG tablet Take 10 mg by mouth daily as needed for allergies.    [provider]  meloxicam (MOBIC) 7.5 MG tablet Take 1 tablet (7.5 mg total) by mouth daily as needed for pain. 08/14/22   Trula Slade, DPM  molnupiravir EUA (LAGEVRIO) 200 MG CAPS capsule Take 4 capsules by mouth every 12 hours for 5 days. 11/17/21     omeprazole (PRILOSEC OTC) 20 MG tablet Take by mouth.    [provider]  ondansetron (ZOFRAN) 4 MG tablet Take 1 tablet (4 mg total) by mouth every 8 (eight) hours as needed for nausea or vomiting. 05/02/21   Salvadore Dom, MD  rizatriptan (MAXALT-MLT) 10 MG disintegrating tablet Take 1 tablet (10 mg total) by mouth as needed for migraine. May repeat in 2 hours if needed 02/16/22   Penumalli, Earlean Polka, MD  Topiramate ER (TROKENDI XR) 50 MG CP24 Take 50 mg by mouth at bedtime. 02/16/22   Penumalli, Earlean Polka, MD  traZODone (DESYREL) 50 MG tablet TAKE 1 TABLET BY MOUTH AT BEDTIME AS NEEDED FOR SLEEP. 11/19/21   Gregor Hams, MD  triamcinolone cream (KENALOG) 0.1 % Apply topically. 08/07/21   [provider]      Allergies    Bee venom, Other, Other, Shellfish allergy, Carrot [daucus carota], and Peanut (diagnostic)    Review of Systems   Review of Systems  Constitutional:  Positive for activity change, appetite change, chills and fatigue.  HENT:  Positive for congestion, postnasal drip, rhinorrhea and sinus pressure. Negative for sore throat, tinnitus, trouble swallowing and voice change.   Respiratory:  Positive for cough and shortness of breath. Negative for apnea, choking, chest tightness, wheezing and stridor.   Cardiovascular:  Positive for chest pain. Negative for  palpitations and leg swelling.  Gastrointestinal: Negative.   Genitourinary: Negative.   Musculoskeletal:  Positive for myalgias.  Skin: Negative.   Neurological:  Positive for weakness (generalized). Negative for dizziness, tremors, seizures, syncope, facial asymmetry, speech difficulty, light-headedness, numbness and headaches.  All other systems reviewed and are negative.   Physical Exam Updated Vital Signs BP 117/71 (BP Location: Right Arm)   Pulse 80   Temp 98.2 F (36.8 C) (Oral)   Resp 17   LMP 09/29/2022   SpO2 99%  Physical Exam Vitals and nursing note reviewed.  Constitutional:      General: She is not in acute distress.    Appearance: She is well-developed. She is not ill-appearing, toxic-appearing or diaphoretic.  HENT:     Head: Normocephalic and atraumatic.     Nose: Congestion and rhinorrhea present.     Mouth/Throat:     Mouth: Mucous membranes are moist.     Comments: PO clear Eyes:     Pupils: Pupils are equal, round, and reactive to light.  Cardiovascular:     Rate and Rhythm: Normal rate.     Pulses: Normal pulses.     Heart sounds: Normal heart sounds.  Pulmonary:     Effort: Pulmonary effort is normal. No respiratory distress.     Breath sounds: Normal breath sounds.     Comments: Clear bil, speaks in full sentences without difficulty Abdominal:     General: Bowel sounds are normal. There is no distension.     Palpations: Abdomen is soft. There is no mass.     Tenderness: There is no abdominal tenderness. There is no right CVA tenderness, left CVA tenderness, guarding or rebound.     Hernia: No hernia is present.  Musculoskeletal:        General: Normal range of motion.     Cervical back: Normal range of motion. No rigidity or tenderness.     Comments: Compartment soft, full range of motion, no bony tenderness  Lymphadenopathy:     Cervical: No cervical adenopathy.  Skin:    General: Skin is warm and dry.     Capillary Refill: Capillary refill  takes less than 2 seconds.  Neurological:     General: No focal deficit present.     Mental Status: She is alert and oriented to person, place, and time.  Psychiatric:        Mood and Affect: Mood normal.     ED Results / Procedures / Treatments   Labs (all labs ordered are listed, but only abnormal results are displayed) Labs Reviewed  CBC WITH DIFFERENTIAL/PLATELET - Abnormal; Notable for the following components:      Result Value   WBC 18.8 (*)    RBC 3.78 (*)    Hemoglobin 11.0 (*)    HCT 33.4 (*)    Neutro Abs 13.3 (*)    Eosinophils Absolute 0.9 (*)    All other  components within normal limits  COMPREHENSIVE METABOLIC PANEL - Abnormal; Notable for the following components:   AST 12 (*)    All other components within normal limits  URINALYSIS, ROUTINE W REFLEX MICROSCOPIC - Abnormal; Notable for the following components:   Color, Urine COLORLESS (*)    All other components within normal limits  SARS CORONAVIRUS 2 BY RT PCR  RESP PANEL BY RT-PCR (FLU A&B, COVID) ARPGX2  MAGNESIUM  D-DIMER, QUANTITATIVE  CK  PREGNANCY, URINE    EKG None  Radiology DG Chest 2 View  Result Date: 10/06/2022 CLINICAL DATA:  Recent worsening of body aches, cough, congestion EXAM: CHEST - 2 VIEW COMPARISON:  Chest radiograph dated 10/03/2022 FINDINGS: Slightly low lung volumes. No focal consolidations. Subtle blunting of the right posterior costophrenic angle. Similar cardiomediastinal silhouette. The visualized skeletal structures are unremarkable. IMPRESSION: 1. No focal consolidations. 2. Subtle blunting of the right posterior costophrenic angle may reflect a trace pleural effusion. Electronically Signed   By: Darrin Nipper M.D.   On: 10/06/2022 08:44    Procedures Procedures    Medications Ordered in ED Medications  albuterol (VENTOLIN HFA) 108 (90 Base) MCG/ACT inhaler 2 puff (2 puffs Inhalation Given 10/06/22 0804)  sodium chloride 0.9 % bolus 1,000 mL (0 mLs Intravenous Stopped  10/06/22 1229)   ED Course/ Medical Decision Making/ A&P    22 year old here for evaluation of feeling unwell after returning from overseas trip to [place].  Was seen here 3 days ago had some hypokalemia on labs however this is subsequently replaced with p.o.  Was given azithromycin, prednisone.  Patient states she continues to feel unwell.  She is nonfocal neuro exam without deficits.  She did get up with albuterol prior to my evaluation, now has clear lung sounds bilaterally.  Speaks in full sentences without difficulty.  Cannot PERC due to recent travel.  She does look clinically dry.  We will offer IV fluids.  Labs and imaging personally viewed and interpreted:  Chest xray without cardiomegaly, pulm edema, pneumothorax, infiltrates COVID, FLU neg CBC leukocytosis 18.8, on steroids CMP without significant abnormality Ddimer 0.32 Mag 2.0 CK 140 Preg negative UA negative for infection EKG without ischemic changes  Patient reassessed.  Ambulatory, tolerating p.o. intake.  Reassessment clear lungs.  Discussed labs and imaging.  Suspect she likely still has viral infection, low suspicion for acute bacterial process, sepsis, PE, pneumothorax.  Will write for symptomatic management.  Encourage close follow-up with PCP, return for new or worsening symptoms.  The patient has been appropriately medically screened and/or stabilized in the ED. I have low suspicion for any other emergent medical condition which would require further screening, evaluation or treatment in the ED or require inpatient management.  Patient is hemodynamically stable and in no acute distress.  Patient able to ambulate in department prior to ED.  Evaluation does not show acute pathology that would require ongoing or additional emergent interventions while in the emergency department or further inpatient treatment.  I have discussed the diagnosis with the patient and answered all questions.  Pain is been managed while in the  emergency department and patient has no further complaints prior to discharge.  Patient is comfortable with plan discussed in room and is stable for discharge at this time.  I have discussed strict return precautions for returning to the emergency department.  Patient was encouraged to follow-up with PCP/specialist refer to at discharge.  Medical Decision Making Amount and/or Complexity of Data Reviewed Independent Historian: parent External Data Reviewed: labs, radiology, ECG and notes. Labs: ordered. Decision-making details documented in ED Course. Radiology: ordered and independent interpretation performed. Decision-making details documented in ED Course. ECG/medicine tests: ordered. Decision-making details documented in ED Course.  Risk OTC drugs. Prescription drug management. Parenteral controlled substances. Decision regarding hospitalization.          Final Clinical Impression(s) / ED Diagnoses Final diagnoses:  Acute cough    Rx / DC Orders ED Discharge Orders          Ordered    benzonatate (TESSALON) 100 MG capsule  Every 8 hours        10/06/22 1312    fluticasone (FLONASE) 50 MCG/ACT nasal spray  Daily        10/06/22 1312              Kearra Calkin A, PA-C 10/06/22 1353    Regan Lemming, MD 10/06/22 2134

## 2022-10-06 NOTE — Discharge Instructions (Addendum)
Take the medications as prescribed.  Make sure to keep close follow-up with your primary care provider, return for new or worsening symptoms.

## 2022-10-06 NOTE — ED Triage Notes (Addendum)
Pt presents POV from home w/family   Pt was seen here last Friday for body aches, cough, congestion, negative for Covid/Strep/RSV/Flu on Friday. Went to [place] Sunday (8th) and returned Thursday (12th). Pt reports symptoms are worse and last night she lost her taste and smell. Pt finished Azithromycin, Prednisone, inhaler, OTC Tylenol and Nyquil

## 2022-10-15 ENCOUNTER — Ambulatory Visit: Payer: Managed Care, Other (non HMO) | Admitting: Family

## 2022-11-17 ENCOUNTER — Institutional Professional Consult (permissible substitution): Payer: Managed Care, Other (non HMO) | Admitting: Internal Medicine

## 2022-12-01 ENCOUNTER — Institutional Professional Consult (permissible substitution): Payer: Managed Care, Other (non HMO) | Admitting: Internal Medicine

## 2022-12-01 NOTE — Progress Notes (Deleted)
   Kari Carrillo, female    DOB: 2001-02-06   MRN: [id number]   Brief patient profile:  ***  *** referred to pulmonary clinic 12/01/2022 by *** for ***        History of Present Illness  12/01/2022  Pulmonary/ 1st office eval/Kari Carrillo  No chief complaint on file.    Dyspnea:  *** Cough: *** Sleep: *** SABA use:   Past Medical History:  Diagnosis Date   Allergy-induced asthma    Asthma    Balance problem    Eczema    GERD (gastroesophageal reflux disease)    Hyperkalemia    Near syncope    Urinary frequency    Vision disturbance     Outpatient Medications Prior to Visit  Medication Sig Dispense Refill   albuterol (VENTOLIN HFA) 108 (90 Base) MCG/ACT inhaler Inhale 1-2 puffs into the lungs every 4 (four) hours as needed for wheezing or shortness of breath. 1 each 0   beclomethasone (QVAR) 40 MCG/ACT inhaler Inhale 1 puff into the lungs as needed.     benzonatate (TESSALON) 100 MG capsule Take 1 capsule (100 mg total) by mouth every 8 (eight) hours. 21 capsule 0   betamethasone valerate ointment (VALISONE) 0.1 % Use a pea sized amount topically BID for up to 2 weeks as needed 30 g 0   budesonide (RHINOCORT AQUA) 32 MCG/ACT nasal spray Place 1 spray into both nostrils 2 (two) times daily as needed. 15 mL 0   EPINEPHrine 0.3 mg/0.3 mL IJ SOAJ injection Inject 0.3 mg into the muscle as needed for anaphylaxis. 1 each 0   fexofenadine (ALLEGRA) 180 MG tablet Take 1 tablet (180 mg total) by mouth daily. 90 tablet 0   fluticasone (FLONASE) 50 MCG/ACT nasal spray Place 2 sprays into both nostrils daily. 16 g 0   loratadine (CLARITIN) 10 MG tablet Take 10 mg by mouth daily as needed for allergies.     meloxicam (MOBIC) 7.5 MG tablet Take 1 tablet (7.5 mg total) by mouth daily as needed for pain. 30 tablet 0   molnupiravir EUA (LAGEVRIO) 200 MG CAPS capsule Take 4 capsules by mouth every 12 hours for 5 days. 40 capsule 0   omeprazole (PRILOSEC OTC) 20 MG tablet Take by mouth.      ondansetron (ZOFRAN) 4 MG tablet Take 1 tablet (4 mg total) by mouth every 8 (eight) hours as needed for nausea or vomiting. 20 tablet 0   rizatriptan (MAXALT-MLT) 10 MG disintegrating tablet Take 1 tablet (10 mg total) by mouth as needed for migraine. May repeat in 2 hours if needed 9 tablet 11   Topiramate ER (TROKENDI XR) 50 MG CP24 Take 50 mg by mouth at bedtime. 90 capsule 3   traZODone (DESYREL) 50 MG tablet TAKE 1 TABLET BY MOUTH AT BEDTIME AS NEEDED FOR SLEEP. 90 tablet 1   triamcinolone cream (KENALOG) 0.1 % Apply topically.     No facility-administered medications prior to visit.     Objective:     There were no vitals taken for this visit.         Assessment   No problem-specific Assessment & Plan notes found for this encounter.     Christinia Gully, MD 12/01/2022

## 2022-12-29 ENCOUNTER — Encounter: Payer: Self-pay | Admitting: Internal Medicine

## 2022-12-29 ENCOUNTER — Ambulatory Visit: Payer: Managed Care, Other (non HMO) | Admitting: Internal Medicine

## 2022-12-29 VITALS — BP 100/60 | HR 80 | Temp 98.0°F | Ht 68.0 in | Wt 188.2 lb

## 2022-12-29 DIAGNOSIS — J309 Allergic rhinitis, unspecified: Secondary | ICD-10-CM

## 2022-12-29 DIAGNOSIS — J4520 Mild intermittent asthma, uncomplicated: Secondary | ICD-10-CM | POA: Diagnosis not present

## 2022-12-29 DIAGNOSIS — R059 Cough, unspecified: Secondary | ICD-10-CM | POA: Diagnosis not present

## 2022-12-29 MED ORDER — BREZTRI AEROSPHERE 160-9-4.8 MCG/ACT IN AERO: 2.0000 | INHALATION_SPRAY | Freq: Two times a day (BID) | RESPIRATORY_TRACT | 0 refills | Status: DC

## 2022-12-29 MED ORDER — BUDESONIDE-FORMOTEROL FUMARATE 80-4.5 MCG/ACT IN AERO: INHALATION_SPRAY | RESPIRATORY_TRACT | 12 refills | Status: DC

## 2022-12-29 NOTE — Assessment & Plan Note (Signed)
Onset ? Age 22 on background of allergic rhinitis since prior to elementary school   Rec: Symbicort 80 2 bid prn  Based on two studies from NEJM  378; 20 p 1865 (2018) and 380 : p2020-30 (2019) in pts with mild asthma it is reasonable to use low dose symbicort eg 80 2bid "prn" flare in this setting but I emphasized this was only shown with symbicort and takes advantage of the rapid onset of action but is not the same as "rescue therapy" but can be stopped once the acute symptoms have resolved and the need for rescue has been minimized (< 2 x weekly)    - The proper method of use, as well as anticipated side effects, of a metered-dose inhaler were discussed and demonstrated to the patient using teach back method.

## 2022-12-29 NOTE — Progress Notes (Unsigned)
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   Allergy eval in [place] age elementary > peanuts/ ragweed > not on shots   Dr [name] [facility] > avoidance / using saba maybe once week  and with exp to cold air Last note  09/22/21    1. Mild intermittent asthma, uncomplicated - Lung testing looked great. - We are not going to make any changes today. - Daily controller medication(s): NOTHING - Prior to physical activity: albuterol 2 puffs 10-15 minutes before physical activity. - Rescue medications: albuterol 4 puffs every 4-6 hours as needed - Changes during respiratory infections or worsening symptoms: Add on Qvar 69mg to 2 puffs twice daily for ONE TO TWO WEEKS. - Asthma control goals:  * Full participation in all desired activities (may need albuterol before activity) * Albuterol use two time or less a week on average (not counting use with activity) * Cough interfering with sleep two time or less a month * Oral steroids no more than once a year * No hospitalizations   2. Seasonal and perennial allergic rhinitis (mouse, horse, grasses, ragweed, weeds, trees, indoor molds, dust mites, cat, dog and cockroach)  - Continue taking: Rhinocort one spray per nostril twice daily, Allegra (fexofenadine) '180mg'$  tablet 1-2 times daily and Astelin (azelastine) 2 sprays per nostril 1-2 times daily as needed - You can use an extra dose of the antihistamine, if needed, for breakthrough symptoms.  - Consider nasal saline rinses 1-2 times daily to remove allergens from the nasal cavities as well as help with mucous clearance (this is especially  helpful to do before the nasal sprays are given) - Consider allergy shots as a means of long-term control.   3. Pollen-food allergy syndrome - Continue avoiding all of your triggering foods. - AWynona Lunais up to date.    4. Otiis externa - Start ciprodex one drop per ear 3-4 times daily for one week.    5. Follow up in 6 months or sooner if needed.       History of Present Illness  12/29/2022  Pulmonary/ 1st office eval/Jaris Kohles since sick Oct 06 2022  when got [place] plane from [place] cough/ sneeze /no fever and cloudy mucus >   neg RVP abx / prednisone/ cough suppression and increased need for neb albuterol > 80% improved main cc nasal congestion  Chief Complaint  Patient presents with   Consult    Virus after [place]. Not able to breathe through nose.  Dyspnea:  treadmill x 20 min x 4.5 at 2% elevation better before virus / has been using qvar once a week prior to ex (not saba as rec by Dr GDarnell Level  Cough: none  Sleep: bothered by nasal congestion > clariton no longer working / nasal steroids cause nose bleeds so avoids them  SABA use: very little   No obvious day to day or daytime pattern/variability or assoc excess/ purulent sputum or mucus plugs or hemoptysis or cp or chest tightness, subjective wheeze or overt  hb symptoms.     Also denies  any obvious fluctuation of symptoms with weather or environmental changes or other aggravating or alleviating factors except as outlined above   No unusual exposure hx or h/o childhood pna  or knowledge of premature birth.  Current Allergies, Complete Past Medical History, Past Surgical History, Family History, and Social History were reviewed in [employer] record.  ROS  The following are not active complaints unless bolded Hoarseness, sore throat, dysphagia, dental problems, itching, sneezing,  nasal congestion or discharge of excess mucus or purulent secretions, ear ache,   fever, chills, sweats, unintended wt  loss or wt gain, classically pleuritic or exertional cp,  orthopnea pnd or arm/hand swelling  or leg swelling, presyncope, palpitations, abdominal pain, anorexia, nausea, vomiting, diarrhea  or change in bowel habits or change in bladder habits, change in stools or change in urine, dysuria, hematuria,  rash, arthralgias, visual complaints, headache, numbness, weakness or ataxia or problems with walking or coordination,  change in mood or  memory.              Past Medical History:  Diagnosis Date   Allergy-induced asthma    Asthma    Balance problem    Eczema    GERD (gastroesophageal reflux disease)    Hyperkalemia    Near syncope    Urinary frequency    Vision disturbance     Outpatient Medications Prior to Visit -  - NOTE:   Unable to verify as accurately reflecting what pt takes    Medication Sig Dispense Refill   albuterol (VENTOLIN HFA) 108 (90 Base) MCG/ACT inhaler Inhale 1-2 puffs into the lungs every 4 (four) hours as needed for wheezing or shortness of breath. 1 each 0   beclomethasone (QVAR) 40 MCG/ACT inhaler Inhale 1 puff into the lungs as needed.     benzonatate (TESSALON) 100 MG capsule Take 1 capsule (100 mg total) by mouth every 8 (eight) hours. (Patient not taking: Reported on 12/29/2022) 21 capsule 0   betamethasone valerate ointment (VALISONE) 0.1 % Use a pea sized amount topically BID for up to 2 weeks as needed (Patient not taking: Reported on 12/29/2022) 30 g 0   budesonide (RHINOCORT AQUA) 32 MCG/ACT nasal spray Place 1 spray into both nostrils 2 (two) times daily as needed. (Patient not taking: Reported on 12/29/2022) 15 mL 0   EPINEPHrine 0.3 mg/0.3 mL IJ SOAJ injection Inject 0.3 mg into the muscle as needed for anaphylaxis. (Patient not taking: Reported on 12/29/2022) 1 each 0   fexofenadine (ALLEGRA) 180 MG tablet Take 1 tablet (180 mg total) by mouth daily. (Patient not taking: Reported on 12/29/2022) 90 tablet 0   fluticasone (FLONASE) 50 MCG/ACT nasal spray Place 2  sprays into both nostrils daily. (Patient not taking: Reported on 12/29/2022) 16 g 0   loratadine (CLARITIN) 10 MG tablet Take 10 mg by mouth daily as needed for allergies. (Patient not taking: Reported on 12/29/2022)     meloxicam (MOBIC) 7.5 MG tablet Take 1 tablet (7.5 mg total) by mouth daily as needed for pain. (Patient not taking: Reported on 12/29/2022) 30 tablet 0   molnupiravir EUA (LAGEVRIO) 200 MG CAPS capsule Take 4 capsules by mouth every 12 hours for 5 days. (Patient not taking: Reported on 12/29/2022) 40 capsule 0   omeprazole (PRILOSEC OTC) 20 MG tablet Take by mouth. (Patient not taking: Reported on 12/29/2022)     ondansetron (ZOFRAN) 4 MG tablet Take 1 tablet (4 mg total) by mouth every 8 (eight) hours as needed  for nausea or vomiting. (Patient not taking: Reported on 12/29/2022) 20 tablet 0   rizatriptan (MAXALT-MLT) 10 MG disintegrating tablet Take 1 tablet (10 mg total) by mouth as needed for migraine. May repeat in 2 hours if needed (Patient not taking: Reported on 12/29/2022) 9 tablet 11   Topiramate ER (TROKENDI XR) 50 MG CP24 Take 50 mg by mouth at bedtime. (Patient not taking: Reported on 12/29/2022) 90 capsule 3   traZODone (DESYREL) 50 MG tablet TAKE 1 TABLET BY MOUTH AT BEDTIME AS NEEDED FOR SLEEP. (Patient not taking: Reported on 12/29/2022) 90 tablet 1   triamcinolone cream (KENALOG) 0.1 % Apply topically. (Patient not taking: Reported on 12/29/2022)     No facility-administered medications prior to visit.     Objective:     BP 100/60 (BP Location: Left Arm)   Pulse 80   Temp 98 F (36.7 C)   Ht '5\' 8"'$  (1.727 m)   Wt 188 lb 3.2 oz (85.4 kg)   SpO2 99%   BMI 28.62 kg/m   SpO2: 99 %  Amb bf nad    HEENT : Oropharynx  clear     Nasal turbinates severe bilateral edema/ pallor s polyps   NECK :  without  apparent JVD/ palpable Nodes/TM    LUNGS: no acc muscle use,  Nl contour chest which is clear to A and P bilaterally without cough on insp or exp maneuvers   CV:  RRR   no s3 or murmur or increase in P2, and no edema   ABD:  soft and nontender with nl inspiratory excursion in the supine position. No bruits or organomegaly appreciated   MS:  Nl gait/ ext warm without deformities Or obvious joint restrictions  calf tenderness, cyanosis or clubbing    SKIN: warm and dry without lesions    NEURO:  alert, approp, nl sensorium with  no motor or cerebellar deficits apparent.      Assessment   Asthma Onset ? Age 65 on background of allergic rhinitis since prior to elementary school   Rec: Symbicort 80 2 bid prn  Based on two studies from NEJM  378; 20 p 1865 (2018) and 380 : p2020-30 (2019) in pts with mild asthma it is reasonable to use low dose symbicort eg 80 2bid "prn" flare in this setting but I emphasized this was only shown with symbicort and takes advantage of the rapid onset of action but is not the same as "rescue therapy" but can be stopped once the acute symptoms have resolved and the need for rescue has been minimized (< 2 x weekly)    - The proper method of use, as well as anticipated side effects, of a metered-dose inhaler were discussed and demonstrated to the patient using teach back method.       Allergic rhinitis Seasonal and perennial allergic rhinitis (mouse, horse, grasses, ragweed, weeds, trees, indoor molds, dust mites, cat, dog and cockroach)    Flare with uri 09/2022    I emphasized that nasal steroids have no immediate benefit in terms of improving symptoms.  To help them reached the target tissue, the patient should use Afrin two puffs every 12 hours applied one min before using the nasal steroids.  Afrin should be stopped after no more than 5 days.  If the symptoms worsen, Afrin can be restarted after 5 days off of therapy to prevent rebound congestion from overuse of Afrin.  I also emphasized that in no way are nasal steroids a concern  in terms of "addiction".   >>> f/u in 3-4 weeks with all meds in hand using a trust but verify  approach to confirm accurate Medication  Reconciliation The principal here is that until we are certain that the  patients are doing what we've asked, it makes no sense to ask them to do more.           Each maintenance medication was reviewed in detail including emphasizing most importantly the difference between maintenance and prns and under what circumstances the prns are to be triggered using an action plan format where appropriate.  Total time for H and P, chart review, counseling, reviewing hfa/neb/nasal device(s) and generating customized AVS unique to this office visit / same day charting  > 45 min with pt new to me          Christinia Gully, MD 12/29/2022

## 2022-12-29 NOTE — Assessment & Plan Note (Signed)
Seasonal and perennial allergic rhinitis (mouse, horse, grasses, ragweed, weeds, trees, indoor molds, dust mites, cat, dog and cockroach)    Flare with uri 09/2022    I emphasized that nasal steroids have no immediate benefit in terms of improving symptoms.  To help them reached the target tissue, the patient should use Afrin two puffs every 12 hours applied one min before using the nasal steroids.  Afrin should be stopped after no more than 5 days.  If the symptoms worsen, Afrin can be restarted after 5 days off of therapy to prevent rebound congestion from overuse of Afrin.  I also emphasized that in no way are nasal steroids a concern in terms of "addiction".   >>> f/u in 3-4 weeks with all meds in hand using a trust but verify approach to confirm accurate Medication  Reconciliation The principal here is that until we are certain that the  patients are doing what we've asked, it makes no sense to ask them to do more.           Each maintenance medication was reviewed in detail including emphasizing most importantly the difference between maintenance and prns and under what circumstances the prns are to be triggered using an action plan format where appropriate.  Total time for H and P, chart review, counseling, reviewing hfa/neb/nasal device(s) and generating customized AVS unique to this office visit / same day charting  > 45 min with pt new to me

## 2022-12-29 NOTE — Patient Instructions (Addendum)
Only use your albuterol as a rescue medication to be used if you can't catch your breath by resting or doing a relaxed purse lip breathing pattern.  - The less you use it, the better it will work when you need it. - Ok to use up to 2 puffs  every 4 hours if you must but call for immediate appointment if use goes up over your usual need - Don't leave home without it !!  (think of it like the spare tire for your car)   Also  Ok to try albuterol 15 min before an activity (on alternating days)  that you know would usually make you short of breath and see if it makes any difference and if makes none then don't take albuterol after activity unless you can't catch your breath as this means it's the resting that helps, not the albuterol.   Symbicort 80 Take 2 puffs first thing in am and then another 2 puffs about 12 hours later until better for a whole week  then ok to use up to every 12 hours as need   Work on inhaler technique:  relax and gently blow all the way out then take a nice smooth full deep breath back in, triggering the inhaler at same time you start breathing in.  Hold breath in for at least  5 seconds if you can. Blow out symbicort 80  thru nose. Rinse and gargle with water when done.  If mouth or throat bother you at all,  try brushing teeth/gums/tongue with arm and hammer toothpaste/ make a slurry and gargle and spit out.      Nasacort AQ one puff twice daily and for the first 5 days use Afrin 1st then stop the afrin then taper off the Nasacort AQ    Please schedule a follow up office visit in 4-6 weeks, call sooner if needed with all medications /inhalers/ solutions in hand so we can verify exactly what you are taking. This includes all medications from all doctors and over the counters

## 2022-12-30 ENCOUNTER — Encounter: Payer: Self-pay | Admitting: Internal Medicine

## 2023-01-24 NOTE — Progress Notes (Deleted)
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   Allergy eval in [place] age elementary > peanuts/ ragweed > not on shots   Dr [name] [facility] > avoidance / using saba maybe once week  and with exp to cold air Last note  09/22/21    1. Mild intermittent asthma, uncomplicated - Lung testing looked great. - We are not going to make any changes today. - Daily controller medication(s): NOTHING - Prior to physical activity: albuterol 2 puffs 10-15 minutes before physical activity. - Rescue medications: albuterol 4 puffs every 4-6 hours as needed - Changes during respiratory infections or worsening symptoms: Add on Qvar 70mg to 2 puffs twice daily for ONE TO TWO WEEKS. - Asthma control goals:  * Full participation in all desired activities (may need albuterol before activity) * Albuterol use two time or less a week on average (not counting use with activity) * Cough interfering with sleep two time or less a month * Oral steroids no more than once a year * No hospitalizations   2. Seasonal and perennial allergic rhinitis (mouse, horse, grasses, ragweed, weeds, trees, indoor molds, dust mites, cat, dog and cockroach)  - Continue taking: Rhinocort one spray per nostril twice daily, Allegra (fexofenadine) '180mg'$  tablet 1-2 times daily and Astelin (azelastine) 2 sprays per nostril 1-2 times daily as needed - You can use an extra dose of the antihistamine, if needed, for breakthrough symptoms.  - Consider nasal saline rinses 1-2 times daily to remove allergens from the nasal cavities as well as help with mucous clearance (this is especially  helpful to do before the nasal sprays are given) - Consider allergy shots as a means of long-term control.   3. Pollen-food allergy syndrome - Continue avoiding all of your triggering foods. - AWynona Carrillo up to date.    4. Otiis externa - Start ciprodex one drop per ear 3-4 times daily for one week.    5. Follow up in 6 months or sooner if needed.       History of Present Illness  12/29/2022  Pulmonary/ 1st office eval/[name] since sick Oct 06 2022  when got [place] plane from [place]= cough/ sneeze /no fever and cloudy mucus >   neg RVP abx / prednisone/ cough suppression and increased need for neb albuterol > 80% improved main cc nasal congestion  Chief Complaint  Patient presents with   Consult    Virus after visit to [place]. Not able to breathe through nose.  Dyspnea:  treadmill x 20 min x 4.5 at 2% elevation better before virus / has been using qvar once a week prior to ex (not saba as rec by Dr GDarnell Level  Cough: none  Sleep: bothered by nasal congestion > clariton no longer working / nasal steroids cause nose bleeds so avoids them  SABA use: very little  Rec Only use your albuterol as a rescue medication  Also  Ok to try albuterol 15 min before an activity (on alternating days)  that you know would usually make you short of breath Symbicort 80  Take 2 puffs first thing in am and then another 2 puffs about 12 hours later until better for a whole week  then ok to use up to every 12 hours as need  Work on inhaler technique:    Nasacort AQ one puff twice daily and for the first 5 days use Afrin 1st then stop the afrin then taper off the Nasacort AQ   Please schedule a follow up office visit in 4-6 weeks, call sooner if needed with all medications /inhalers/ solutions in hand so we can verify exactly what you are taking. This includes all medications from all doctors and over the counters    01/26/2023  f/u ov/Kari Carrillo re: asthma/allergic rhinitis   maint on ***  No chief complaint on file.    Dyspnea:  *** Cough: *** Sleeping: *** SABA use: *** 02: *** Covid status:   *** Lung cancer screening :  ***    No obvious day to day or daytime variability or assoc excess/ purulent sputum or mucus plugs or hemoptysis or cp or chest tightness, subjective wheeze or overt sinus or hb symptoms.   *** without nocturnal  or early am exacerbation  of respiratory  c/o's or need for noct saba. Also denies any obvious fluctuation of symptoms with weather or environmental changes or other aggravating or alleviating factors except as outlined above   No unusual exposure hx or h/o childhood pna or knowledge of premature birth.  Current Allergies, Complete Past Medical History, Past Surgical History, Family History, and Social History were reviewed in [employer] record.  ROS  The following are not active complaints unless bolded Hoarseness, sore throat, dysphagia, dental problems, itching, sneezing,  nasal congestion or discharge of excess mucus or purulent secretions, ear ache,   fever, chills, sweats, unintended wt loss or wt gain, classically pleuritic or exertional cp,  orthopnea pnd or arm/hand swelling  or leg swelling, presyncope, palpitations, abdominal pain, anorexia, nausea, vomiting, diarrhea  or change in bowel habits or change in bladder habits, change in stools or change in urine, dysuria, hematuria,  rash, arthralgias, visual complaints, headache, numbness, weakness or ataxia or problems with walking or coordination,  change in mood or  memory.        No outpatient medications have been marked as taking for the 01/26/23 encounter (Appointment) with [name], MD.          Past Medical History:  Diagnosis Date   Allergy-induced asthma    Asthma    Balance problem    Eczema    GERD (gastroesophageal reflux disease)    Hyperkalemia    Near syncope    Urinary frequency    Vision disturbance         Objective:     Wt Readings from Last 3  Encounters:  12/29/22 188 lb 3.2 oz (85.4 kg)  10/03/22 180 lb (81.6 kg)  06/19/22 179 lb 7.3 oz (81.4 kg)      Vital signs reviewed  01/26/2023  - Note at rest 02 sats  ***% on ***   General appearance:    ***   severe bilateral edema/ pallor s polyps           Assessment

## 2023-01-26 ENCOUNTER — Ambulatory Visit: Payer: Managed Care, Other (non HMO) | Admitting: Internal Medicine

## 2023-02-22 ENCOUNTER — Encounter: Payer: Self-pay | Admitting: Diagnostic Neuroimaging

## 2023-02-22 ENCOUNTER — Ambulatory Visit: Payer: Managed Care, Other (non HMO) | Admitting: Diagnostic Neuroimaging

## 2023-02-22 VITALS — BP 122/71 | HR 87 | Ht 68.0 in | Wt 190.0 lb

## 2023-02-22 DIAGNOSIS — G43009 Migraine without aura, not intractable, without status migrainosus: Secondary | ICD-10-CM

## 2023-02-22 MED ORDER — NURTEC 75 MG PO TBDP: 75.0000 mg | ORAL_TABLET | Freq: Every day | ORAL | 6 refills | Status: DC | PRN

## 2023-02-22 NOTE — Patient Instructions (Signed)
  MIGRAINE PREVENTION  LIFESTYLE CHANGES -Stop or avoid smoking -Decrease or avoid caffeine / alcohol -Eat and sleep on a regular schedule -Exercise several times per week  MIGRAINE RESCUE (1 migraine every other month) - ibuprofen, tylenol as needed - nurtec as needed

## 2023-02-22 NOTE — Progress Notes (Signed)
[facility]  PATIENT: Kari Carrillo DOB: 2001-01-05  REFERRING CLINICIAN: Janie Morning, DO HISTORY FROM: patient  REASON FOR VISIT: follow up   HISTORICAL  CHIEF COMPLAINT:  Chief Complaint  Patient presents with   Follow-up    Rm 6 with father Elta Gu[place] Pt is well, reports her migraines are not as frequent but the intensity is worse when she does have one. She has about 1 migraine every 2 months that last all day,.     HISTORY OF PRESENT ILLNESS:   UPDATE (02/22/23, VRP): Since last visit, doing well with migraines. Only 1 migraine every other month (less stress lately). Rizatriptan causes some allergy type feeling in the throat.   UPDATE (02/16/22, VRP): Since last visit, still with 5-6 headaches per month. Some light sensitivity. Tried TPX and trokendi; now on trokendi as needed.   UPDATE (07/08/21, VRP): Since last visit, neck pain is improved with physical therapy.  Continues to have headaches.  Previously headaches were hours at a time, left or right-sided, throbbing pain.  No nausea or vomiting.  No sensitive light or sound.  Now patient having headaches that are more generalized, back of the head, back of the neck, lasting 2 to 3 minutes at a time.  She still feels a heartbeat throbbing sensation in her head.  UPDATE (08/21/20, VRP): Since last visit, doing well, except for occ pre-syncope events (2 per month). Trying to eat and drink more, but often skips breakfast and has restrictions due to food allergies, sensitivities and preference. Had  Some irregular menses, elevated prolactin level, and wanted to review MRI brain from prior (pituitary review). More stress at school.   PRIOR HPI: 22 year old female here for evaluation of lightheadedness and dizziness.  02/27/2020 patient was at home.  Her father had a syncopal event and had to go to the hospital.  Later that morning she was at the sink washing dishes when all of a sudden she felt lightheaded and dizzy.   Her mother was there and helped her sit down.  Patient briefly had tunnel vision but did not fully lose consciousness.  EMS called to patient's home and initial blood pressure was 64/48.  She was having nausea and headaches.  Blood pressure improved to 99/62 upon arrival to ER.  Since that time patient has had follow-up with PCP and MRI of the brain.  Few nonspecific T2 hyperintensities were noted.  Patient's father has multiple sclerosis (also patient of mine) and therefore consultation was requested.  Patient has been having some intermittent balance issues urinary frequency issues.  Also having some intermittent visual changes.   REVIEW OF SYSTEMS: Full 14 system review of systems performed and negative with exception of: as per HPI.   ALLERGIES: Allergies  Allergen Reactions   Bee Venom Anaphylaxis   Other Anaphylaxis, Shortness Of Breath and Other (See Comments)    Raw fruit, A1 sauce- throat closes Other reaction(s): tongue nuumbness   Other Anaphylaxis and Other (See Comments)    *per pt* all fruits and vegetables that grow on trees- including medication containing these products.    Shellfish Allergy Anaphylaxis   Carrot [Daucus Carota]     Throat get itchy raw or cooked   Peanut (Diagnostic)     Other reaction(s): throat itching    HOME MEDICATIONS: Outpatient Medications Prior to Visit  Medication Sig Dispense Refill   albuterol (VENTOLIN HFA) 108 (90 Base) MCG/ACT inhaler Inhale 1-2 puffs into the lungs every 4 (four) hours as needed for wheezing  or shortness of breath. 1 each 0   budesonide-formoterol (SYMBICORT) 80-4.5 MCG/ACT inhaler Take 2 puffs first thing in am and then another 2 puffs about 12 hours later. (Patient not taking: Reported on 02/22/2023) 1 each 12   No facility-administered medications prior to visit.    PAST MEDICAL HISTORY: Past Medical History:  Diagnosis Date   Allergy-induced asthma    Asthma    Balance problem    Eczema    GERD  (gastroesophageal reflux disease)    Hyperkalemia    Near syncope    Urinary frequency    Vision disturbance     PAST SURGICAL HISTORY: History reviewed. No pertinent surgical history.  FAMILY HISTORY: Family History  Problem Relation Age of Onset   Asthma Mother    Allergic rhinitis Mother    Eczema Mother    Food Allergy Mother    Urticaria Mother    Diabetes Father    Multiple sclerosis Father    Diabetes Paternal Grandmother    Heart attack Paternal Grandmother    Angioedema Neg Hx    Immunodeficiency Neg Hx     SOCIAL HISTORY: Social History   Socioeconomic History   Marital status: Single    Spouse name: Not on file   Number of children: Not on file   Years of education: Not on file   Highest education level: Some college, no degree  Occupational History   Not on file  Tobacco Use   Smoking status: Never   Smokeless tobacco: Never  Vaping Use   Vaping Use: Never used  Substance and Sexual Activity   Alcohol use: No   Drug use: No   Sexual activity: Never    Birth control/protection: None  Other Topics Concern   Not on file  Social History Narrative          Social Determinants of Health   Financial Resource Strain: Not on file  Food Insecurity: Not on file  Transportation Needs: Not on file  Physical Activity: Not on file  Stress: Not on file  Social Connections: Not on file  Intimate Partner Violence: Not on file     PHYSICAL EXAM  GENERAL EXAM/CONSTITUTIONAL: Vitals:  Vitals:   02/22/23 1540  BP: 122/71  Pulse: 87  Weight: 190 lb (86.2 kg)  Height: '5\' 8"'$  (1.727 m)   Body mass index is 28.89 kg/m. Wt Readings from Last 3 Encounters:  02/22/23 190 lb (86.2 kg)  12/29/22 188 lb 3.2 oz (85.4 kg)  10/03/22 180 lb (81.6 kg)   Patient is in no distress; well developed, nourished and groomed; neck is supple  CARDIOVASCULAR: Examination of carotid arteries is normal; no carotid bruits Regular rate and rhythm, no  murmurs Examination of peripheral vascular system by observation and palpation is normal  EYES: Ophthalmoscopic exam of optic discs and posterior segments is normal; no papilledema or hemorrhages No results found.  MUSCULOSKELETAL: Gait, strength, tone, movements noted in Neurologic exam below  NEUROLOGIC: MENTAL STATUS:      No data to display         awake, alert, oriented to person, place and time recent and remote memory intact normal attention and concentration language fluent, comprehension intact, naming intact fund of knowledge appropriate  CRANIAL NERVE:  2nd - no papilledema on fundoscopic exam 2nd, 3rd, 4th, 6th - pupils equal and reactive to light, visual fields full to confrontation, extraocular muscles intact, no nystagmus 5th - facial sensation symmetric 7th - facial strength symmetric 8th - hearing  intact 9th - palate elevates symmetrically, uvula midline 11th - shoulder shrug symmetric 12th - tongue protrusion midline  MOTOR:  normal bulk and tone, full strength in the BUE, BLE  SENSORY:  normal and symmetric to light touch, temperature, vibration  COORDINATION:  finger-nose-finger, fine finger movements normal  REFLEXES:  deep tendon reflexes present and symmetric  GAIT/STATION:  narrow based gait    DIAGNOSTIC DATA (LABS, IMAGING, TESTING) - I reviewed patient records, labs, notes, testing and imaging myself where available.  Lab Results  Component Value Date   WBC 18.8 (H) 10/06/2022   HGB 11.0 (L) 10/06/2022   HCT 33.4 (L) 10/06/2022   MCV 88.4 10/06/2022   PLT 286 10/06/2022      Component Value Date/Time   NA 140 10/06/2022 1047   NA 140 08/16/2020 1130   K 3.7 10/06/2022 1047   CL 103 10/06/2022 1047   CO2 27 10/06/2022 1047   GLUCOSE 84 10/06/2022 1047   BUN 9 10/06/2022 1047   BUN 8 08/16/2020 1130   CREATININE 0.58 10/06/2022 1047   CREATININE 0.58 05/02/2021 0956   CALCIUM 9.4 10/06/2022 1047   PROT 7.5 10/06/2022  1047   ALBUMIN 4.5 10/06/2022 1047   AST 12 (L) 10/06/2022 1047   ALT 10 10/06/2022 1047   ALKPHOS 62 10/06/2022 1047   BILITOT 0.3 10/06/2022 1047   GFRNONAA >60 10/06/2022 1047   GFRAA 152 08/16/2020 1130   Lab Results  Component Value Date   CHOL 155 05/02/2021   HDL 55 05/02/2021   LDLCALC 87 05/02/2021   TRIG 44 05/02/2021   CHOLHDL 2.8 05/02/2021   Lab Results  Component Value Date   HGBA1C 5.3 05/02/2021   No results found for: "VITAMINB12" Lab Results  Component Value Date   TSH 0.578 08/06/2020     04/25/20 MRI brain [I reviewed images myself and agree with interpretation. Normal pituitary. -VRP]  1. Few scattered foci of T2/FLAIR hyperintensity involving the periventricular and subcortical white matter both cerebral hemispheres, nonspecific. Differential considerations are broad, and include sequelae of accelerated/hereditary chronic small-vessel ischemia, changes related to prior trauma, hypercoagulable state, vasculitis, sequelae of complicated migraines, prior infectious or inflammatory process, or possibly demyelination. No associated enhancement. 2. Otherwise unremarkable and normal brain MRI.  05/24/20 MRI cervical / thoracic spine - normal  04/17/21 MRI brain (without) demonstrating: - Few scattered punctate foci of bifrontal and left temporal gliosis, stable from 04/25/2020. - No acute findings.  No change from prior MRI.   04/17/21 MRI cervical spine - normal    ASSESSMENT AND PLAN  22 y.o. year old female here with:  Cannot take rizatriptan (intolerance).  Dx:  1. Migraine without aura and without status migrainosus, not intractable       PLAN:  HEADACHES (throbbing, neck pain, unilateral; hours; -N/V, - photo/phono)  MIGRAINE TREATMENT PLAN:  MIGRAINE PREVENTION  LIFESTYLE CHANGES -Stop or avoid smoking -Decrease or avoid caffeine / alcohol -Eat and sleep on a regular schedule -Exercise several times per week  MIGRAINE RESCUE  (1 migraine every other month) - ibuprofen, tylenol as needed - nurtec as needed  Meds ordered this encounter  Medications   Rimegepant Sulfate (NURTEC) 75 MG TBDP    Sig: Take 1 tablet (75 mg total) by mouth daily as needed.    Dispense:  8 tablet    Refill:  6   Return in about 6 months (around 08/25/2023) for with NP, MyChart visit (20mn).    VPenni Bombard MD 02/22/2023,  [id number] PM Certified in Neurology, Neurophysiology and [facility] [address] [place], [place] [address] [phone]

## 2023-05-18 ENCOUNTER — Ambulatory Visit: Payer: Managed Care, Other (non HMO) | Admitting: Radiology

## 2023-05-18 ENCOUNTER — Encounter: Payer: Self-pay | Admitting: Radiology

## 2023-05-18 VITALS — BP 116/82

## 2023-05-18 DIAGNOSIS — N760 Acute vaginitis: Secondary | ICD-10-CM

## 2023-05-18 DIAGNOSIS — B3731 Acute candidiasis of vulva and vagina: Secondary | ICD-10-CM

## 2023-05-18 DIAGNOSIS — R300 Dysuria: Secondary | ICD-10-CM

## 2023-05-18 LAB — URINALYSIS, COMPLETE W/RFL CULTURE
Bacteria, UA: NONE SEEN /HPF
Bilirubin Urine: NEGATIVE
Casts: NONE SEEN /LPF
Crystals: NONE SEEN /HPF
Glucose, UA: NEGATIVE
Hgb urine dipstick: NEGATIVE
Hyaline Cast: NONE SEEN /LPF
Ketones, ur: NEGATIVE
Leukocyte Esterase: NEGATIVE
Nitrites, Initial: NEGATIVE
RBC / HPF: NONE SEEN /HPF (ref 0–2)
Specific Gravity, Urine: 1.031 (ref 1.001–1.035)
WBC, UA: NONE SEEN /HPF (ref 0–5)
Yeast: NONE SEEN /HPF
pH: 5.5 (ref 5.0–8.0)

## 2023-05-18 LAB — WET PREP FOR TRICH, YEAST, CLUE

## 2023-05-18 LAB — NO CULTURE INDICATED

## 2023-05-18 MED ORDER — FLUCONAZOLE 150 MG PO TABS: 150.0000 mg | ORAL_TABLET | Freq: Once | ORAL | 0 refills | Status: AC

## 2023-05-18 NOTE — Progress Notes (Signed)
      Subjective: Kari Carrillo is a 22 y.o. female who complains of vaginal discharge, itching, dysuria. Symptoms began 1 week ago. Treated with Diflucan 100mg  #5 by PCP (last dose will be today). Started a new job, now wearing scrubs and feels they do not breathe as well.   Review of Systems  All other systems reviewed and are negative.   Past Medical History:  Diagnosis Date   Allergy-induced asthma    Asthma    Balance problem    Eczema    GERD (gastroesophageal reflux disease)    Hyperkalemia    Near syncope    Urinary frequency    Vision disturbance       Objective:  Today's Vitals   05/18/23 0930  BP: 116/82   There is no height or weight on file to calculate BMI.   -General: no acute distress -Vulva: without lesions or discharge -Vagina: discharge present, aptima swab and wet prep obtained -Perineum: no lesions -Uterus: Mobile, non tender -Adnexa: no masses or tenderness   Microscopic wet-mount exam shows hyphae.   [name], CMA present for exam  Assessment:/Plan:   1. Dysuria Neg u/a pt reassured - Urinalysis,Complete w/RFL Culture  2. Acute vaginitis May have needed standard dose of fluconazole, will try that and reassess in 1-2 weeks at [facility] - WET PREP FOR TRICH, YEAST, CLUE - fluconazole (DIFLUCAN) 150 MG tablet; Take 1 tablet (150 mg total) by mouth once for 1 dose.  Dispense: 1 tablet; Refill: 0    Avoid the use of soaps or perfumed products in the peri area. Avoid tub baths and sitting in sweaty or wet clothing for prolonged periods of time.

## 2023-05-20 ENCOUNTER — Ambulatory Visit: Payer: Managed Care, Other (non HMO) | Admitting: Radiology

## 2023-05-25 ENCOUNTER — Ambulatory Visit (INDEPENDENT_AMBULATORY_CARE_PROVIDER_SITE_OTHER): Payer: Managed Care, Other (non HMO) | Admitting: Radiology

## 2023-05-25 ENCOUNTER — Encounter: Payer: Self-pay | Admitting: Radiology

## 2023-05-25 VITALS — BP 122/72 | Ht 67.0 in | Wt 186.0 lb

## 2023-05-25 DIAGNOSIS — Z01419 Encounter for gynecological examination (general) (routine) without abnormal findings: Secondary | ICD-10-CM

## 2023-05-25 NOTE — Progress Notes (Signed)
   Kari Carrillo [birth year] [id number]   History:  23 y.o. G0 presents for annual exam. Virginal. S/p treatment for vaginal yeast last week, symptoms have resolved.  Gynecologic History Patient's last menstrual period was 05/18/2023 (exact date). Period Cycle (Days): 28 Period Pattern: Regular (periods last 2-9 days) Menstrual Flow: Light Menstrual Control: Maxi pad, Thin pad Dysmenorrhea: (!) Mild Dysmenorrhea Symptoms: Cramping Contraception/Family planning: abstinence Sexually active: no Last Pap: 2022. Results were: normal   Obstetric History OB History  Gravida Para Term Preterm AB Living  0 0 0 0 0 0  SAB IAB Ectopic Multiple Live Births  0 0 0 0 0     The following portions of the patient's history were reviewed and updated as appropriate: allergies, current medications, past family history, past medical history, past social history, past surgical history, and problem list.  Review of Systems Pertinent items noted in HPI and remainder of comprehensive ROS otherwise negative.   Past medical history, past surgical history, family history and social history were all reviewed and documented in the EPIC chart.   Exam:  Vitals:   05/25/23 0956  BP: 122/72  Weight: 186 lb (84.4 kg)  Height: 5\' 7"  (1.702 m)   Body mass index is 29.13 kg/m.  General appearance:  Normal Thyroid:  Symmetrical, normal in size, without palpable masses or nodularity. Respiratory  Auscultation:  Clear without wheezing or rhonchi Cardiovascular  Auscultation:  Regular rate, without rubs, murmurs or gallops  Edema/varicosities:  Not grossly evident Abdominal  Soft,nontender, without masses, guarding or rebound.  Liver/spleen:  No organomegaly noted  Hernia:  None appreciated  Skin  Inspection:  Grossly normal Breasts: Examined lying and sitting.   Right: Without masses, retractions, nipple discharge or axillary adenopathy.   Left: Without masses, retractions, nipple discharge or  axillary adenopathy. Genitourinary   Inguinal/mons:  Normal without inguinal adenopathy  External genitalia:  Normal appearing vulva with no masses, tenderness, or lesions  BUS/Urethra/Skene's glands:  Normal without masses or exudate  Vagina:  Normal appearing with normal color and discharge, no lesions  Cervix:  Normal appearing without discharge or lesions  Uterus:  Normal in size, shape and contour.  Mobile, nontender  Adnexa/parametria:     Rt: Normal in size, without masses or tenderness.   Lt: Normal in size, without masses or tenderness.  Anus and perineum: Normal   [name], CMA present for exam  Assessment/Plan:   1. Well woman exam with routine gynecological exam Pap due 2025     Discussed SBE, pap screening as directed/appropriate. Recommend of exercise weekly, including weight bearing exercise. Encouraged the use of seatbelts and sunscreen. Return in 1 year for annual or as needed.   Arlie Solomons B WHNP-BC 10:09 AM 05/25/2023

## 2023-08-01 ENCOUNTER — Other Ambulatory Visit: Payer: Self-pay

## 2023-08-01 ENCOUNTER — Emergency Department (HOSPITAL_BASED_OUTPATIENT_CLINIC_OR_DEPARTMENT_OTHER)
Admission: EM | Admit: 2023-08-01 | Discharge: 2023-08-02 | Disposition: A | Discharge status: Home / Self Care | Payer: Managed Care, Other (non HMO) | Attending: Emergency Medicine | Admitting: Emergency Medicine

## 2023-08-01 DIAGNOSIS — H60501 Unspecified acute noninfective otitis externa, right ear: Secondary | ICD-10-CM

## 2023-08-01 DIAGNOSIS — H9201 Otalgia, right ear: Secondary | ICD-10-CM | POA: Diagnosis present

## 2023-08-01 DIAGNOSIS — Z91010 Allergy to peanuts: Secondary | ICD-10-CM | POA: Diagnosis not present

## 2023-08-01 MED ORDER — CIPROFLOXACIN HCL 500 MG PO TABS: 500.0000 mg | ORAL_TABLET | Freq: Two times a day (BID) | ORAL | 0 refills | Status: AC

## 2023-08-01 MED ORDER — OXYCODONE-ACETAMINOPHEN 5-325 MG PO TABS: 1.0000 | ORAL_TABLET | Freq: Four times a day (QID) | ORAL | 0 refills | Status: DC | PRN

## 2023-08-01 MED ORDER — KETOROLAC TROMETHAMINE 60 MG/2ML IM SOLN
30.0000 mg | Freq: Once | INTRAMUSCULAR | Status: AC
  Administered 2023-08-02: 30 mg via INTRAMUSCULAR
  Filled 2023-08-01: qty 2

## 2023-08-01 MED ORDER — KETOROLAC TROMETHAMINE 10 MG PO TABS: 10.0000 mg | ORAL_TABLET | Freq: Once | ORAL | Status: DC

## 2023-08-01 MED ORDER — CIPROFLOXACIN-DEXAMETHASONE 0.3-0.1 % OT SUSP: 4.0000 [drp] | Freq: Two times a day (BID) | OTIC | 0 refills | Status: DC

## 2023-08-01 MED ORDER — OXYCODONE-ACETAMINOPHEN 5-325 MG PO TABS
1.0000 | ORAL_TABLET | Freq: Once | ORAL | Status: AC
  Administered 2023-08-02: 1 via ORAL
  Filled 2023-08-01: qty 1

## 2023-08-01 MED ORDER — KETOROLAC TROMETHAMINE 10 MG PO TABS: 10.0000 mg | ORAL_TABLET | Freq: Four times a day (QID) | ORAL | 0 refills | Status: DC | PRN

## 2023-08-01 NOTE — Discharge Instructions (Addendum)
Your symptoms are consistent with persistent otitis externa and potentially a ruptured tympanic membrane (eardrum).  I will represcribe your Ciprodex drops and start you on an oral antibiotic as you did not initially improve with drops alone.  Pain control I am prescribing oral Toradol which is an NSAID, take this instead of ibuprofen.  I am also prescribing a short course of opiates.  Keep in mind that there is Tylenol in the Percocet that I am prescribing and watch your daily dose of Tylenol ingestion.

## 2023-08-01 NOTE — ED Triage Notes (Signed)
Pt has right ear infection and has been compliant with ABX. Today is the last day of them and pain was improving but today the pain came back worse. Pt hasright ear pain that radiates to right jaw and head and to right shoulder. Denies chest pain SOB

## 2023-08-01 NOTE — ED Provider Notes (Signed)
[place] EMERGENCY DEPARTMENT AT [facility] Provider Note   CSN: [id number] Arrival date & time: 08/01/23  2032     History  Chief Complaint  Patient presents with   Otalgia    Kari Carrillo is a 22 y.o. female.   Otalgia    22 year old female presenting to the emergency department with a chief complaint of ear pain in the setting of otitis externa.  The patient followed outpatient with Dr. Suszanne Conners of ENT and was prescribed a 10-day course of Ciprodex drops.  She states that she initially was improving but subsequently had a sharp pain 1 day ago and then has had persistent pain in her right ear that radiates to her right jaw and to her head and neck.  She denies any chest pain, shortness of breath.  She had no fevers.  She has completed her 10-day course of Ciprodex drops and is not getting better.  Home Medications Prior to Admission medications   Medication Sig Start Date End Date Taking? Authorizing Provider  ciprofloxacin (CIPRO) 500 MG tablet Take 1 tablet (500 mg total) by mouth every 12 (twelve) hours for 7 days. 08/01/23 08/08/23 Yes Ernie Avena, MD  ciprofloxacin-dexamethasone (CIPRODEX) OTIC suspension Place 4 drops into the right ear 2 (two) times daily. Take for 7-10 days 08/01/23  Yes Ernie Avena, MD  ketorolac (TORADOL) 10 MG tablet Take 1 tablet (10 mg total) by mouth every 6 (six) hours as needed. 08/01/23  Yes Ernie Avena, MD  oxyCODONE-acetaminophen (PERCOCET/ROXICET) 5-325 MG tablet Take 1 tablet by mouth every 6 (six) hours as needed for severe pain. 08/01/23  Yes Ernie Avena, MD  albuterol (VENTOLIN HFA) 108 (90 Base) MCG/ACT inhaler Inhale 1-2 puffs into the lungs every 4 (four) hours as needed for wheezing or shortness of breath. 10/03/22   Achille Rich, PA-C  mometasone (ELOCON) 0.1 % cream Apply topically daily as needed. 05/21/23   [provider]      Allergies    Bee venom, Other, Other, Shellfish allergy, Carrot [daucus  carota], and Peanut (diagnostic)    Review of Systems   Review of Systems  HENT:  Positive for ear pain.     Physical Exam Updated Vital Signs BP 127/71   Pulse 69   Temp 98.7 F (37.1 C) (Oral)   Resp 19   Ht 5\' 7"  (1.702 m)   Wt 79.4 kg   LMP 07/19/2023   SpO2 100%   BMI 27.41 kg/m  Physical Exam Vitals and nursing note reviewed.  Constitutional:      General: She is not in acute distress. HENT:     Head: Normocephalic and atraumatic.     Comments: No mastoid tenderness    Left Ear: Tympanic membrane, ear canal and external ear normal.     Ears:     Comments: Right external auditory canal with persistent debris and swelling and erythema consistent with otitis externa, due to degree of swelling, right TM not visualized Eyes:     Conjunctiva/sclera: Conjunctivae normal.     Pupils: Pupils are equal, round, and reactive to light.  Cardiovascular:     Rate and Rhythm: Normal rate and regular rhythm.  Pulmonary:     Effort: Pulmonary effort is normal. No respiratory distress.  Abdominal:     General: There is no distension.     Tenderness: There is no guarding.  Musculoskeletal:        General: No deformity or signs of injury.     Cervical back:  Neck supple.  Skin:    Findings: No lesion or rash.  Neurological:     General: No focal deficit present.     Mental Status: She is alert. Mental status is at baseline.     ED Results / Procedures / Treatments   Labs (all labs ordered are listed, but only abnormal results are displayed) Labs Reviewed - No data to display  EKG None  Radiology No results found.  Procedures Procedures    Medications Ordered in ED Medications  oxyCODONE-acetaminophen (PERCOCET/ROXICET) 5-325 MG per tablet 1 tablet (has no administration in time range)  ketorolac (TORADOL) injection 30 mg (has no administration in time range)    ED Course/ Medical Decision Making/ A&P                                 Medical Decision  Making    22 year old female presenting to the emergency department with a chief complaint of ear pain in the setting of otitis externa.  The patient followed outpatient with Dr. Suszanne Conners of ENT and was prescribed a 10-day course of Ciprodex drops.  She states that she initially was improving but subsequently had a sharp pain 1 day ago and then has had persistent pain in her right ear that radiates to her right jaw and to her head and neck.  She denies any chest pain, shortness of breath.  She had no fevers.  She has completed her 10-day course of Ciprodex drops and is not getting better. On arrival, the patient was vitally stable.  Afebrile, not tachycardic or tachypneic, hemodynamically stable.  Physical Exam: Right external auditory canal with persistent debris and swelling and erythema consistent with otitis externa, due to degree of swelling, right TM not visualized.  Patient presenting with otitis externa that is persistent and not improving with topical Ciprodex drops alone.  Will represcribe the Ciprodex drops in addition will prescribe an oral antibiotic.  Patient having trouble with pain control at home.  Given her improvement and subsequent severe worsening, suspect possible TM rupture although I am not able to fully visualize this on exam.  No tenderness of the mastoid, patient well-appearing, tolerating oral intake, hemodynamically and vitally stable.  Will prescribe Toradol and Percocet for pain control and advised that the patient follow-up closely with her ENT physician outpatient.   Final Clinical Impression(s) / ED Diagnoses Final diagnoses:  Acute otitis externa of right ear, unspecified type    Rx / DC Orders ED Discharge Orders          Ordered    ciprofloxacin (CIPRO) 500 MG tablet  Every 12 hours        08/01/23 2349    ciprofloxacin-dexamethasone (CIPRODEX) OTIC suspension  2 times daily        08/01/23 2349    ketorolac (TORADOL) 10 MG tablet  Every 6 hours PRN         08/01/23 2350    oxyCODONE-acetaminophen (PERCOCET/ROXICET) 5-325 MG tablet  Every 6 hours PRN        08/01/23 2352              Ernie Avena, MD 08/01/23 2355

## 2023-08-24 NOTE — Patient Instructions (Signed)
Below is our plan:  We will switch rizatriptan to Nurtec. Take 1 tablet daily as needed. Do not repeat dose for 24 hours.   Please make sure you are staying well hydrated. I recommend 50-60 ounces daily. Well balanced diet and regular exercise encouraged. Consistent sleep schedule with 6-8 hours recommended.   Please continue follow up with care team as directed.   Follow up with me in 1 year   You may receive a survey regarding today's visit. I encourage you to leave honest feed back as I do use this information to improve patient care. Thank you for seeing me today!   GENERAL HEADACHE INFORMATION:   Natural supplements: Magnesium Oxide or Magnesium Glycinate 500 mg at bed (up to 800 mg daily) Coenzyme Q10 300 mg in AM Vitamin B2- 200 mg twice a day   Add 1 supplement at a time since even natural supplements can have undesirable side effects. You can sometimes buy supplements cheaper (especially Coenzyme Q10) at www.WebmailGuide.co.za or at [facility].  Migraine with aura: There is increased risk for stroke in women with migraine with aura and a contraindication for the combined contraceptive pill for use by women who have migraine with aura. The risk for women with migraine without aura is lower. However other risk factors like smoking are far more likely to increase stroke risk than migraine. There is a recommendation for no smoking and for the use of OCPs without estrogen such as progestogen only pills particularly for women with migraine with aura.Marland Kitchen People who have migraine headaches with auras may be 3 times more likely to have a stroke caused by a blood clot, compared to migraine patients who don't see auras. Women who take hormone-replacement therapy may be 30 percent more likely to suffer a clot-based stroke than women not taking medication containing estrogen. Other risk factors like smoking and high blood pressure may be  much more important.    Vitamins and herbs that show potential:    Magnesium: Magnesium (250 mg twice a day or 500 mg at bed) has a relaxant effect on smooth muscles such as blood vessels. Individuals suffering from frequent or daily headache usually have low magnesium levels which can be increase with daily supplementation of 400-750 mg. Three trials found 40-90% average headache reduction  when used as a preventative. Magnesium may help with headaches are aura, the best evidence for magnesium is for migraine with aura is its thought to stop the cortical spreading depression we believe is the pathophysiology of migraine aura.Magnesium also demonstrated the benefit in menstrually related migraine.  Magnesium is part of the messenger system in the serotonin cascade and it is a good muscle relaxant.  It is also useful for constipation which can be a side effect of other medications used to treat migraine. Good sources include nuts, whole grains, and tomatoes. Side Effects: loose stool/diarrhea  Riboflavin (vitamin B 2) 200 mg twice a day. This vitamin assists nerve cells in the production of ATP a principal energy storing molecule.  It is necessary for many chemical reactions in the body.  There have been at least 3 clinical trials of riboflavin using 400 mg per day all of which suggested that migraine frequency can be decreased.  All 3 trials showed significant improvement in over half of migraine sufferers.  The supplement is found in bread, cereal, milk, meat, and poultry.  Most Americans get more riboflavin than the recommended daily allowance, however riboflavin deficiency is not necessary for the supplements to help  prevent headache. Side effects: energizing, green urine   Coenzyme Q10: This is present in almost all cells in the body and is critical component for the conversion of energy.  Recent studies have shown that a nutritional supplement of CoQ10 can reduce the frequency of migraine attacks by improving the energy production of cells as with riboflavin.  Doses of  150 mg twice a day have been shown to be effective.   Melatonin: Increasing evidence shows correlation between melatonin secretion and headache conditions.  Melatonin supplementation has decreased headache intensity and duration.  It is widely used as a sleep aid.  Sleep is natures way of dealing with migraine.  A dose of 3 mg is recommended to start for headaches including cluster headache. Higher doses up to 15 mg has been reviewed for use in Cluster headache and have been used. The rationale behind using melatonin for cluster is that many theories regarding the cause of Cluster headache center around the disruption of the normal circadian rhythm in the brain.  This helps restore the normal circadian rhythm.   HEADACHE DIET: Foods and beverages which may trigger migraine Note that only 20% of headache patients are food sensitive. You will know if you are food sensitive if you get a headache consistently 20 minutes to 2 hours after eating a certain food. Only cut out a food if it causes headaches, otherwise you might remove foods you enjoy! What matters most for diet is to eat a well balanced healthy diet full of vegetables and low fat protein, and to not miss meals.   Chocolate, other sweets ALL cheeses except cottage and cream cheese Dairy products, yogurt, sour cream, ice cream Liver Meat extracts (Bovril, Marmite, meat tenderizers) Meats or fish which have undergone aging, fermenting, pickling or smoking. These include: Hotdogs,salami,Lox,sausage, mortadellas,smoked salmon, pepperoni, Pickled herring Pods of broad bean (English beans, Chinese pea pods, [place] (fava) beans, lima and navy beans Ripe avocado, ripe banana Yeast extracts or active yeast preparations such as Brewer's or Fleishman's (commercial bakes goods are permitted) Tomato based foods, pizza (lasagna, etc.)   MSG (monosodium glutamate) is disguised as many things; look for these common aliases: Monopotassium  glutamate Autolysed yeast Hydrolysed protein Sodium caseinate "flavorings" "all natural preservatives" Nutrasweet   Avoid all other foods that convincingly provoke headaches.   Resources: The Dizzy [place]air Laundry Your Headache Diet, migrainestrong.com  https://zamora-andrews.com/   Caffeine and Migraine For patients that have migraine, caffeine intake more than 3 days per week can lead to dependency and increased migraine frequency. I would recommend cutting back on your caffeine intake as best you can. The recommended amount of caffeine is 200-300 mg daily, although migraine patients may experience dependency at even lower doses. While you may notice an increase in headache temporarily, cutting back will be helpful for headaches in the long run. For more information on caffeine and migraine, visit: https://americanmigrainefoundation.org/resource-library/caffeine-and-migraine/   Headache Prevention Strategies:   1. Maintain a headache diary; learn to identify and avoid triggers.  - This can be a simple note where you log when you had a headache, associated symptoms, and medications used - There are several smartphone apps developed to help track migraines: Migraine Buddy, Migraine Monitor, Curelator N1-Headache App   Common triggers include: Emotional triggers: Emotional/Upset family or friends Emotional/Upset occupation Business reversal/success Anticipation anxiety Crisis-serious Post-crisis periodNew job/position   Physical triggers: Vacation Day Weekend Strenuous Exercise High Altitude Location New Move Menstrual Day Physical Illness Oversleep/Not enough sleep Weather changes Light: Photophobia or light  sesnitivity treatment involves a balance between desensitization and reduction in overly strong input. Use dark polarized glasses outside, but not inside. Avoid bright or fluorescent light, but do not dim environment to the point  that going into a normally lit room hurts. Consider FL-41 tint lenses, which reduce the most irritating wavelengths without blocking too much light.  These can be obtained at axonoptics.com or theraspecs.com Foods: see list above.   2. Limit use of acute treatments (over-the-counter medications, triptans, etc.) to no more than 2 days per week or 10 days per month to prevent medication overuse headache (rebound headache).     3. Follow a regular schedule (including weekends and holidays): Don't skip meals. Eat a balanced diet. 8 hours of sleep nightly. Minimize stress. Exercise 30 minutes per day. Being overweight is associated with a 5 times increased risk of chronic migraine. Keep well hydrated and drink 6-8 glasses of water per day.   4. Initiate non-pharmacologic measures at the earliest onset of your headache. Rest and quiet environment. Relax and reduce stress. Breathe2Relax is a free app that can instruct you on    some simple relaxtion and breathing techniques. Http://Dawnbuse.com is a    free website that provides teaching videos on relaxation.  Also, there are  many apps that   can be downloaded for "mindful" relaxation.  An app called YOGA NIDRA will help walk you through mindfulness. Another app called Calm can be downloaded to give you a structured mindfulness guide with daily reminders and skill development. Headspace for guided meditation Mindfulness Based Stress Reduction Online Course: www.palousemindfulness.com Cold compresses.   5. Don't wait!! Take the maximum allowable dosage of prescribed medication at the first sign of migraine.   6. Compliance:  Take prescribed medication regularly as directed and at the first sign of a migraine.   7. Communicate:  Call your physician when problems arise, especially if your headaches change, increase in frequency/severity, or become associated with neurological symptoms (weakness, numbness, slurred speech, etc.). Proceed to emergency room  if you experience new or worsening symptoms or symptoms do not resolve, if you have new neurologic symptoms or if headache is severe, or for any concerning symptom.   8. Headache/pain management therapies: Consider various complementary methods, including medication, behavioral therapy, psychological counselling, biofeedback, massage therapy, acupuncture, dry needling, and other modalities.  Such measures may reduce the need for medications. Counseling for pain management, where patients learn to function and ignore/minimize their pain, seems to work very well.   9. Recommend changing family's attention and focus away from patient's headaches. Instead, emphasize daily activities. If first question of day is 'How are your headaches/Do you have a headache today?', then patient will constantly think about headaches, thus making them worse. Goal is to re-direct attention away from headaches, toward daily activities and other distractions.   10. Helpful Websites: www.AmericanHeadacheSociety.org PatentHood.ch www.headaches.org TightMarket.nl www.achenet.org

## 2023-08-24 NOTE — Progress Notes (Unsigned)
PATIENT: Kari Carrillo DOB: 16-Nov-2001  REASON FOR VISIT: follow up HISTORY FROM: patient  Virtual Visit via Telephone Note  I connected with Kari Carrillo on 08/26/23 at  9:30 AM EDT by telephone and verified that I am speaking with the correct person using two identifiers.   I discussed the limitations, risks, security and privacy concerns of performing an evaluation and management service by telephone and the availability of in person appointments. I also discussed with the patient that there may be a patient responsible charge related to this service. The patient expressed understanding and agreed to proceed.   History of Present Illness:  08/26/23 ALL: Kari Carrillo is a 22 y.o. female here today for follow up for migraines. She was last seen by Dr Marjory Lies 02/2023 and doing well. Averaging 1 migraine eery other month. Rizatriptan was switched to Nurtec. Since, she reports that she is doing well. She has had 2 migraines since last visit. She did not know Nurtec was called in and continued rizatriptan. She does continue to experience a tickling/scratchy sensation in her throat when taking rizatriptan. It works well but causes sleepiness.   History (copied from Dr Richrd Humbles previous note)  UPDATE (02/22/23, VRP): Since last visit, doing well with migraines. Only 1 migraine every other month (less stress lately). Rizatriptan causes some allergy type feeling in the throat.    UPDATE (02/16/22, VRP): Since last visit, still with 5-6 headaches per month. Some light sensitivity. Tried TPX and trokendi; now on trokendi as needed.    UPDATE (07/08/21, VRP): Since last visit, neck pain is improved with physical therapy.  Continues to have headaches.  Previously headaches were hours at a time, left or right-sided, throbbing pain.  No nausea or vomiting.  No sensitive light or sound.  Now patient having headaches that are more generalized, back of the head, back of the neck, lasting 2 to 3  minutes at a time.  She still feels a heartbeat throbbing sensation in her head.   UPDATE (08/21/20, VRP): Since last visit, doing well, except for occ pre-syncope events (2 per month). Trying to eat and drink more, but often skips breakfast and has restrictions due to food allergies, sensitivities and preference. Had  Some irregular menses, elevated prolactin level, and wanted to review MRI brain from prior (pituitary review). More stress at school.    PRIOR HPI: 22 year old female here for evaluation of lightheadedness and dizziness.   02/27/2020 patient was at home.  Her father had a syncopal event and had to go to the hospital.  Later that morning she was at the sink washing dishes when all of a sudden she felt lightheaded and dizzy.  Her mother was there and helped her sit down.  Patient briefly had tunnel vision but did not fully lose consciousness.  EMS called to patient's home and initial blood pressure was 64/48.  She was having nausea and headaches.  Blood pressure improved to 99/62 upon arrival to ER.   Since that time patient has had follow-up with PCP and MRI of the brain.  Few nonspecific T2 hyperintensities were noted.  Patient's father has multiple sclerosis (also patient of mine) and therefore consultation was requested.  Patient has been having some intermittent balance issues urinary frequency issues.  Also having some intermittent visual changes.   Observations/Objective:  Generalized: Well developed, in no acute distress  Mentation: Alert oriented to time, place, history taking. Follows all commands speech and language fluent   Assessment and Plan:  22 y.o.  year old female  has a past medical history of Allergy-induced asthma, Asthma, Balance problem, Eczema, GERD (gastroesophageal reflux disease), Hyperkalemia, Near syncope, Urinary frequency, and Vision disturbance. here with    ICD-10-CM   1. Migraine without aura and without status migrainosus, not intractable  G43.009       [name] is doing very well. She reports having 2 migraines since last visit. She is having allergic reaction to rizatriptan. I will switch her to Nurtec as needed. Healthy lifestyle habits advised. She will follow up in 1 year.    No orders of the defined types were placed in this encounter.   Meds ordered this encounter  Medications   Rimegepant Sulfate (NURTEC) 75 MG TBDP    Sig: Take 1 tablet (75 mg total) by mouth daily as needed (take for abortive therapy of migraine, no more than 1 tablet in 24 hours or 10 per month).    Dispense:  8 tablet    Refill:  11    Order Specific Question:   Supervising Provider    Answer:   Anson Fret [[id number]]     Follow Up Instructions:  I discussed the assessment and treatment plan with the patient. The patient was provided an opportunity to ask questions and all were answered. The patient agreed with the plan and demonstrated an understanding of the instructions.   The patient was advised to call back or seek an in-person evaluation if the symptoms worsen or if the condition fails to improve as anticipated.  I provided 15 minutes of non-face-to-face time during this encounter. Patient located at their place of residence during Mychart visit. Provider is in the office.    Shawnie Dapper, NP

## 2023-08-26 ENCOUNTER — Telehealth: Payer: Managed Care, Other (non HMO) | Admitting: Family Medicine

## 2023-08-26 ENCOUNTER — Encounter: Payer: Self-pay | Admitting: Family Medicine

## 2023-08-26 DIAGNOSIS — G43009 Migraine without aura, not intractable, without status migrainosus: Secondary | ICD-10-CM

## 2023-08-26 MED ORDER — NURTEC 75 MG PO TBDP: 75.0000 mg | ORAL_TABLET | Freq: Every day | ORAL | 11 refills | Status: DC | PRN

## 2023-10-25 NOTE — Telephone Encounter (Signed)
Closing previous encounter  Denyce Robert, NP

## 2023-11-15 ENCOUNTER — Ambulatory Visit (INDEPENDENT_AMBULATORY_CARE_PROVIDER_SITE_OTHER): Payer: Managed Care, Other (non HMO)

## 2023-12-09 ENCOUNTER — Ambulatory Visit (INDEPENDENT_AMBULATORY_CARE_PROVIDER_SITE_OTHER): Payer: Managed Care, Other (non HMO) | Admitting: Otolaryngology

## 2023-12-09 ENCOUNTER — Encounter (INDEPENDENT_AMBULATORY_CARE_PROVIDER_SITE_OTHER): Payer: Self-pay

## 2023-12-09 VITALS — Ht 67.0 in | Wt 170.0 lb

## 2023-12-09 DIAGNOSIS — H60391 Other infective otitis externa, right ear: Secondary | ICD-10-CM

## 2023-12-09 DIAGNOSIS — H6121 Impacted cerumen, right ear: Secondary | ICD-10-CM | POA: Diagnosis not present

## 2023-12-09 DIAGNOSIS — H6061 Unspecified chronic otitis externa, right ear: Secondary | ICD-10-CM | POA: Diagnosis not present

## 2023-12-12 DIAGNOSIS — H6061 Unspecified chronic otitis externa, right ear: Secondary | ICD-10-CM | POA: Insufficient documentation

## 2023-12-12 NOTE — Progress Notes (Signed)
Patient ID: Kari Carrillo, female   DOB: Sep 19, 2001, 22 y.o.   MRN: [id number]  Follow-up: Chronic right otitis externa  HPI: The patient is a 22 year old female who returns today for her follow-up evaluation.  The patient has a history of chronic right ear infection, with recurrent exacerbations.  In August 2024, the patient was noted to have an acute right otitis externa, with fungal debris's within the right ear canal.  She was treated with debridement and CSF powder.  The patient returns today reporting no recent acute infection.  She denies any otalgia, otorrhea, or change in her hearing.  She still has occasional clogging sensation in her ears.  Exam: General: Communicates without difficulty, well nourished, no acute distress. Head: Normocephalic, no evidence injury, no tenderness, facial buttresses intact without stepoff. Face/sinus: No tenderness to palpation and percussion. Facial movement is normal and symmetric. Eyes: PERRL, EOMI. No scleral icterus, conjunctivae clear. Neuro: CN II exam reveals vision grossly intact.  No nystagmus at any point of gaze. Ears: Auricles well formed without lesions.  Right ear cerumen impaction.  After the cerumen removal procedure, both tympanic membranes and middle ear spaces are noted to be normal.  The right ear canal is mildly erythematous.  Nose: External evaluation reveals normal support and skin without lesions.  Dorsum is intact.  Anterior rhinoscopy reveals congested mucosa over anterior aspect of inferior turbinates and intact septum.  No purulence noted. Oral:  Oral cavity and oropharynx are intact, symmetric, without erythema or edema.  Mucosa is moist without lesions. Neck: Full range of motion without pain.  There is no significant lymphadenopathy.  No masses palpable.  Thyroid bed within normal limits to palpation.  Parotid glands and submandibular glands equal bilaterally without mass.  Trachea is midline. Neuro:  CN 2-12 grossly intact.    Assessment: 1.  Chronic right otitis externa.  Notes recent acute exacerbation was noted. 2.  Right ear cerumen impaction.  After the cerumen removal procedure, both tympanic membranes and middle ear spaces are noted to be normal.  Plan: 1.  The physical exam findings are reviewed with the patient. 2.  CSF powder weekly for infection prophylaxis. 3.  The patient will return for reevaluation in 6 months.

## 2024-06-08 ENCOUNTER — Ambulatory Visit (INDEPENDENT_AMBULATORY_CARE_PROVIDER_SITE_OTHER): Payer: Managed Care, Other (non HMO) | Admitting: Otolaryngology

## 2024-12-15 ENCOUNTER — Emergency Department (HOSPITAL_BASED_OUTPATIENT_CLINIC_OR_DEPARTMENT_OTHER)
Admission: EM | Admit: 2024-12-15 | Discharge: 2024-12-15 | Disposition: A | Discharge status: Home / Self Care | Attending: Emergency Medicine | Admitting: Emergency Medicine

## 2024-12-15 ENCOUNTER — Encounter (HOSPITAL_BASED_OUTPATIENT_CLINIC_OR_DEPARTMENT_OTHER): Payer: Self-pay

## 2024-12-15 ENCOUNTER — Other Ambulatory Visit: Payer: Self-pay

## 2024-12-15 DIAGNOSIS — Z91010 Allergy to peanuts: Secondary | ICD-10-CM | POA: Insufficient documentation

## 2024-12-15 DIAGNOSIS — J101 Influenza due to other identified influenza virus with other respiratory manifestations: Secondary | ICD-10-CM | POA: Diagnosis not present

## 2024-12-15 DIAGNOSIS — J45909 Unspecified asthma, uncomplicated: Secondary | ICD-10-CM | POA: Insufficient documentation

## 2024-12-15 DIAGNOSIS — R509 Fever, unspecified: Secondary | ICD-10-CM | POA: Diagnosis present

## 2024-12-15 LAB — RESP PANEL BY RT-PCR (RSV, FLU A&B, COVID)  RVPGX2
Influenza A by PCR: POSITIVE — AB
Influenza B by PCR: NEGATIVE
Resp Syncytial Virus by PCR: NEGATIVE
SARS Coronavirus 2 by RT PCR: NEGATIVE

## 2024-12-15 MED ORDER — OSELTAMIVIR PHOSPHATE 75 MG PO CAPS: 75.0000 mg | ORAL_CAPSULE | Freq: Two times a day (BID) | ORAL | 0 refills | Status: DC

## 2024-12-15 MED ORDER — BENZONATATE 100 MG PO CAPS: 100.0000 mg | ORAL_CAPSULE | Freq: Three times a day (TID) | ORAL | 0 refills | Status: DC

## 2024-12-15 MED ORDER — ACETAMINOPHEN 325 MG PO TABS
650.0000 mg | ORAL_TABLET | Freq: Once | ORAL | Status: AC | PRN
  Administered 2024-12-15: 650 mg via ORAL

## 2024-12-15 NOTE — Discharge Instructions (Addendum)
 It was a pleasure to in care of you today.  Based on your history and physical exam I feel you are safe for discharge.  Today you tested positive for influenza A.  Your physical exam was reassuring.  I have sent in a prescription for Tessalon  Perles which is a cough medication as well as Tamiflu .  Please pick these medications up from the pharmacy and take as prescribed.  For help with pain and fever you may alternate over-the-counter Tylenol  and ibuprofen .  You may also consider Zyrtec or Claritin for allergy symptoms, recommend Flonase  as well which is a nasal decongestant medication.  Please return to the emergency department if you experience in the following symptoms including but not limited to refractory fever, chest pain, shortness of breath, weakness, dehydration, excessive nausea/vomiting, or other concerning symptom.  Please follow-up with your primary care provider and make them aware of all results and findings today.  Recommend follow-up within 48 hours if symptoms worsen.  Otherwise recommend follow-up with your primary care provider within the next 1 to 2 weeks.  Continue to use your at home albuterol  for shortness of breath as needed.

## 2024-12-15 NOTE — ED Triage Notes (Signed)
 Pt reports flu like S/S since last PM.

## 2024-12-15 NOTE — ED Provider Notes (Cosign Needed Addendum)
 " [place] [facility] DE[facility]MENT AT [facility] Provider Note   CSN: [id number] Arrival date & time: 12/15/24  1201     Patient presents with: Influenza   Kari Carrillo is a 23 y.o. female who presents to the emergency department with her mother who is also experiencing similar symptoms.  States that she has had a fever, body aches, chills, as well as a sore throat and right ear pain.  Patient states that she developed the symptoms yesterday.  Past medical history significant for asthma, anxiety, eustachian tube disorder, GERD, chronic otitis externa of right ear, etc.  Patient denies chest pain or shortness of breath.  States that she has her at home albuterol  inhaler which she uses as needed for shortness of breath and wheezing but has not had to use it with her current symptoms.  Does appreciate known sick contact at church who had the flu.     Influenza Presenting symptoms: fever        Prior to Admission medications  Medication Sig Start Date End Date Taking? Authorizing Provider  benzonatate  (TESSALON ) 100 MG capsule Take 1 capsule (100 mg total) by mouth every 8 (eight) hours. 12/15/24  Yes Samaya Boardley F, PA-C  oseltamivir  (TAMIFLU ) 75 MG capsule Take 1 capsule (75 mg total) by mouth every 12 (twelve) hours. 12/15/24  Yes Dajana Gehrig F, PA-C  albuterol  (VENTOLIN  HFA) 108 (90 Base) MCG/ACT inhaler Inhale 1-2 puffs into the lungs every 4 (four) hours as needed for wheezing or shortness of breath. 10/03/22   Bernis Ernst, PA-C  ciprofloxacin -dexamethasone  (CIPRODEX ) OTIC suspension Place 4 drops into the right ear 2 (two) times daily. Take for 7-10 days 08/01/23   Jerrol Agent, MD  ketorolac  (TORADOL ) 10 MG tablet Take 1 tablet (10 mg total) by mouth every 6 (six) hours as needed. 08/01/23   Lawsing, James, MD  mometasone (ELOCON) 0.1 % cream Apply topically daily as needed. 05/21/23   [provider]  oxyCODONE -acetaminophen  (PERCOCET/ROXICET) 5-325 MG  tablet Take 1 tablet by mouth every 6 (six) hours as needed for severe pain. 08/01/23   Jerrol Agent, MD  Rimegepant Sulfate (NURTEC) 75 MG TBDP Take 1 tablet (75 mg total) by mouth daily as needed (take for abortive therapy of migraine, no more than 1 tablet in 24 hours or 10 per month). 08/26/23   Lomax, Amy, NP    Allergies: Bee venom, Other, Other, Shellfish allergy, Carrot [daucus carota], Peanut (diagnostic), and Rizatriptan     Review of Systems  Constitutional:  Positive for fever.    Updated Vital Signs BP 103/63 (BP Location: Right Arm)   Pulse (!) 101   Temp 98 F (36.7 C)   Resp 18   Ht 5' 7 (1.702 m)   Wt 81.6 kg   SpO2 98%   BMI 28.19 kg/m   Physical Exam Vitals and nursing note reviewed.  Constitutional:      General: She is awake. She is not in acute distress.    Appearance: Normal appearance. She is not toxic-appearing or diaphoretic.     Comments: Uncomfortable appearing  HENT:     Head: Normocephalic and atraumatic.     Right Ear: Tympanic membrane, ear canal and external ear normal. There is no impacted cerumen.     Left Ear: Tympanic membrane, ear canal and external ear normal. There is no impacted cerumen.     Nose: Rhinorrhea present.     Mouth/Throat:     Mouth: Mucous membranes are moist.  Pharynx: Uvula midline. No oropharyngeal exudate or posterior oropharyngeal erythema.     Tonsils: No tonsillar exudate or tonsillar abscesses.  Eyes:     General: No scleral icterus. Neck:     Comments: Patient able to look left, right, touch chin to chest, and look up at the ceiling, no stiff neck Cardiovascular:     Rate and Rhythm: Normal rate and regular rhythm.  Pulmonary:     Effort: Pulmonary effort is normal. No respiratory distress.     Breath sounds: Normal breath sounds. No stridor. No wheezing, rhonchi or rales.  Musculoskeletal:        General: Normal range of motion.     Cervical back: Normal range of motion.     Right lower leg: No edema.      Left lower leg: No edema.     Comments: Ambulatory without assistance, grossly normal range of motion all 4 extremities  Skin:    General: Skin is warm.     Capillary Refill: Capillary refill takes less than 2 seconds.  Neurological:     General: No focal deficit present.     Mental Status: She is alert and oriented to person, place, and time.  Psychiatric:        Mood and Affect: Mood normal.        Behavior: Behavior normal. Behavior is cooperative.     (all labs ordered are listed, but only abnormal results are displayed) Labs Reviewed  RESP PANEL BY RT-PCR (RSV, FLU A&B, COVID)  RVPGX2 - Abnormal; Notable for the following components:      Result Value   Influenza A by PCR POSITIVE (*)    All other components within normal limits    EKG: None  Radiology: No results found.   Procedures   Medications Ordered in the ED  acetaminophen  (TYLENOL ) tablet 650 mg (650 mg Oral Given 12/15/24 1243)                                    Medical Decision Making Risk OTC drugs. Prescription drug management.   Patient presents to the ED for concern of URI-like symptoms, this involves an extensive number of treatment options, and is a complaint that carries with it a high risk of complications and morbidity.  The differential diagnosis includes Viral syndrome including Covid, Flu, RSV, other viral syndrome, pneumonia, strep throat, otitis media, etc.   Co morbidities that complicate the patient evaluation  asthma, anxiety, eustachian tube disorder, GERD, chronic otitis externa of right ear   Lab Tests:  I Ordered, and personally interpreted labs.  The pertinent results include:  Positive for influenza A   Medicines ordered and prescription drug management:  I ordered medication including tylenol  for fever and flu-like symptoms Reevaluation of the patient after these medicines showed that the patient improved I have reviewed the patients home medicines and have made  adjustments as needed   Test Considered:  none   Critical Interventions:  none   Problem List / ED Course:  23 year old female, presents with mother with similar infectious symptoms, flulike symptoms started yesterday, vital signs significant for fever at triage, improved after Tylenol  On physical exam patient is well-appearing, no chest pain or shortness of breath, no obvious abnormality to auscultation of her lungs, no sign of asthma exacerbation Instructed on positive influenza A testing today, will instruct on supportive care outpatient No indication to treat an asthma  exacerbation at this time, however patient has developed symptoms over the last 24 hours, will treat with Tamiflu  as well as cough medication, instructed on over-the-counter symptomatic treatment as well Considered strep however no exudate or appreciable erythema at time of my exam, patient also has a cough making strep unlikely Return precautions given Patient discharged Most likely diagnosis at this time is uncomplicated influenza A infection   Reevaluation:  After the interventions noted above, I reevaluated the patient and found that they have :improved   Social Determinants of Health:  none   Dispostion:  After consideration of the diagnostic results and the patients response to treatment, I feel that the patient would benefit from discharge and outpatient therapy as described.       Final diagnoses:  Influenza A    ED Discharge Orders          Ordered    oseltamivir  (TAMIFLU ) 75 MG capsule  Every 12 hours        12/15/24 1710    benzonatate  (TESSALON ) 100 MG capsule  Every 8 hours        12/15/24 1710               Shaymus Eveleth F, PA-C 12/16/24 1038    Anayely Constantine F, PA-C 12/16/24 1041  "

## 2025-01-02 ENCOUNTER — Ambulatory Visit: Admitting: Allergy & Immunology

## 2025-01-02 ENCOUNTER — Encounter: Payer: Self-pay | Admitting: Allergy & Immunology

## 2025-01-02 ENCOUNTER — Other Ambulatory Visit: Payer: Self-pay

## 2025-01-02 VITALS — BP 108/68 | HR 89 | Temp 98.1°F | Resp 18 | Ht 67.0 in | Wt 196.0 lb

## 2025-01-02 DIAGNOSIS — J3089 Other allergic rhinitis: Secondary | ICD-10-CM

## 2025-01-02 DIAGNOSIS — J4520 Mild intermittent asthma, uncomplicated: Secondary | ICD-10-CM | POA: Diagnosis not present

## 2025-01-02 DIAGNOSIS — T7819XD Other adverse food reactions, not elsewhere classified, subsequent encounter: Secondary | ICD-10-CM

## 2025-01-02 DIAGNOSIS — J302 Other seasonal allergic rhinitis: Secondary | ICD-10-CM

## 2025-01-02 MED ORDER — NEFFY 2 MG/0.1ML NA SOLN: 2.0000 mg | NASAL | 1 refills | Status: AC | PRN

## 2025-01-02 MED ORDER — ALBUTEROL SULFATE HFA 108 (90 BASE) MCG/ACT IN AERS: 2.0000 | INHALATION_SPRAY | RESPIRATORY_TRACT | 1 refills | Status: AC | PRN

## 2025-01-02 NOTE — Progress Notes (Addendum)
 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 Seasonal and perennial allergic rhinitis - We will update testing at the next visit.  - We will see what this shows.   2. Mild intermittent asthma, uncomplicated - Lung testing looks great today. - Daily controller medication(s):  - Prior to physical activity: albuterol  2 puffs 10-15 minutes before physical activity. - Rescue medications: albuterol  4 puffs every 4-6 hours as needed - Asthma control goals:  * Full participation in all desired activities (may need albuterol  before activity) * Albuterol  use two time or less a week on average (not counting use with activity) * Cough interfering with sleep two time or less a month * Oral steroids no more than once a year * No hospitalizations  3. Pollen-food allergy syndrome - with propensity to have allergic reactions to fruit  - We will do testing at the next visit. - Sheet provided to circle the foods that you are interested in testing.  4. Return in about 2 weeks (around 01/16/2025) for SKIN TESTING. You can have the follow up appointment with Dr. Iva or a Nurse Practicioner (our Nurse Practitioners are excellent and always have Physician oversight!).   This note in its entirety was forwarded to the Provider who requested this consultation.  Subjective:   Kari Carrillo is a 24 y.o. female presenting today for evaluation of  Chief Complaint  Patient presents with   Allergy Testing    Food allergy testing    Asthma    Kari Grudzien has  a history of the following: Patient Active Problem List   Diagnosis Date Noted   Chronic otitis externa of right ear 12/12/2023   Chronic migraine without aura without status migrainosus, not intractable 12/10/2021   Mild intermittent asthma, uncomplicated 09/22/2021   Seasonal and perennial allergic rhinitis 09/22/2021   Allergic rhinitis 05/02/2021   Anxiety 05/02/2021   Asthma 05/02/2021   Eustachian tube disorder 05/02/2021   Pollen-food allergy syndrome, subsequent encounter 05/02/2021   Tinnitus of left ear 05/02/2021   Gastroesophageal reflux disease without esophagitis 08/05/2015   Irritable bowel syndrome with diarrhea 08/05/2015    History obtained from: chart review and patient.  Discussed the use of AI scribe software for clinical note transcription with the patient and/or guardian, who gave verbal consent to proceed.  Kari Dowse was referred by Verdia Lombard, MD.     Kari Carrillo is a 24 y.o. female presenting for an evaluation of multiple atopic complaints.  She was previously followed by us , but is for more than 3 years since we saw her.  We last saw her in October 2022.  At that time, lung testing looked great.  She was doing fantastic with albuterol  and Qvar as needed.  For her rhinitis, she continue with Rhinocort  as well as Allegra  and Astelin .  Allergy shots were discussed for long-term control.  She continue to avoid all of her triggering foods.  Her epinephrine  was up-to-date.  We diagnosed her with an otitis externa from telegram and Ciprodex .  Since the last visit, she has mostly done well.  Asthma/Respiratory Symptom  History: She got the flu around Christmas time. She used her albuterol  frequently she she had the flu. She did get Tamiflu  that has helped a lot. She has a history of asthma, typically triggered by illness. During a recent bout of the flu, she used her albuterol  inhaler a few times. She has had asthma since childhood and has used albuterol   in the past when sick.   Allergic Rhinitis Symptom History: She does not take anything for it on the regular. She does use Claritin as needed. She denies sinus infections. She has learned to live with it. She is not snoring or coughing at night. Regarding environmental allergies, her symptoms remain unchanged. She does not regularly take medications for these allergies, as she finds them ineffective unless symptoms are severe, at which point she uses Claritin Rytab, which she finds helpful. No sinus infections, significant congestion, snoring, or coughing at night.  Food Allergy Symptom History: She is losing her fruit allergy which is good news. She did try some bites of an apple and did not have a reaction at all. She just does not eat fruit at all. She did have som apples without a problem. She continues to avoid tomatoes as well as carrots.   She has a history of fruit allergies since first grade, which she believes may be improving. Recently, she consumed a few bites of an apple without experiencing a reaction, although she has not consumed fruit regularly. She avoids both raw and cooked fruits, as past experiences with items like apple pies have caused discomfort. She also reports past issues with certain vegetables, such as carrots and grape tomatoes. She does not currently have an up-to-date EpiPen . She mentions a past shortage that affected availability.  Otherwise, there is no history of other atopic diseases, including drug allergies, stinging insect allergies, or contact dermatitis. There is no significant infectious history. Vaccinations are up to date.    Past Medical History: Patient Active Problem List   Diagnosis Date Noted   Chronic otitis externa of right ear 12/12/2023   Chronic migraine without aura without status migrainosus, not intractable 12/10/2021   Mild intermittent asthma, uncomplicated 09/22/2021   Seasonal and perennial allergic rhinitis 09/22/2021   Allergic rhinitis  05/02/2021   Anxiety 05/02/2021   Asthma 05/02/2021   Eustachian tube disorder 05/02/2021   Pollen-food allergy syndrome, subsequent encounter 05/02/2021   Tinnitus of left ear 05/02/2021   Gastroesophageal reflux disease without esophagitis 08/05/2015   Irritable bowel syndrome with diarrhea 08/05/2015    Medication List:  Allergies as of 01/02/2025       Reactions   Bee Venom Anaphylaxis   Other Anaphylaxis, Shortness Of Breath, Other (See Comments)   Raw fruit, A1 sauce- throat closes Other reaction(s): tongue nuumbness   Other Anaphylaxis, Other (See Comments)   *per pt* all fruits and vegetables that grow on trees- including medication containing these products.    Shellfish Allergy Anaphylaxis   Carrot [daucus Carota]    Throat get itchy raw or cooked   Peanut (diagnostic)    Other reaction(s): throat itching   Rizatriptan  Itching   Itchy/scratchy throat         Medication List        Accurate as of January 02, 2025 12:47 PM. If you have any questions, ask your nurse or doctor.          albuterol  108 (90 Base) MCG/ACT inhaler Commonly known as: VENTOLIN  HFA Inhale 1-2 puffs into the lungs every 4 (four)  hours as needed for wheezing or shortness of breath. What changed: Another medication with the same name was added. Make sure you understand how and when to take each. Changed by: Marty Shaggy, MD   albuterol  108 [phone] Base) MCG/ACT inhaler Commonly known as: VENTOLIN  HFA Inhale 2 puffs into the lungs every 4 (four) hours as needed for wheezing or shortness of breath. What changed: You were already taking a medication with the same name, and this prescription was added. Make sure you understand how and when to take each. Changed by: Marty Shaggy, MD   benzonatate  100 MG capsule Commonly known as: TESSALON  Take 1 capsule (100 mg total) by mouth every 8 (eight) hours.   ciprofloxacin -dexamethasone  OTIC suspension Commonly known as: Ciprodex  Place 4 drops  into the right ear 2 (two) times daily. Take for 7-10 days   ketorolac  10 MG tablet Commonly known as: TORADOL  Take 1 tablet (10 mg total) by mouth every 6 (six) hours as needed.   mometasone 0.1 % cream Commonly known as: ELOCON Apply topically daily as needed.   Neffy  2 MG/0.1ML Soln Generic drug: EPINEPHrine  Place 2 mg into the nose as needed. Started by: Marty Shaggy, MD   Nurtec 75 MG Tbdp Generic drug: Rimegepant Sulfate Take 1 tablet (75 mg total) by mouth daily as needed (take for abortive therapy of migraine, no more than 1 tablet in 24 hours or 10 per month).   oseltamivir  75 MG capsule Commonly known as: TAMIFLU  Take 1 capsule (75 mg total) by mouth every 12 (twelve) hours.   oxyCODONE -acetaminophen  5-325 MG tablet Commonly known as: PERCOCET/ROXICET Take 1 tablet by mouth every 6 (six) hours as needed for severe pain.        Birth History: non-contributory  Developmental History: non-contributory  Past Surgical History: History reviewed. No pertinent surgical history.   Family History: Family History  Problem Relation Age of Onset   Asthma Mother    Allergic rhinitis Mother    Eczema Mother    Food Allergy Mother    Urticaria Mother    Diabetes Father    Multiple sclerosis Father    Diabetes Paternal Grandmother    Heart attack Paternal Grandmother    Angioedema Neg Hx    Immunodeficiency Neg Hx      Social History: Montie lives at home with her family.  She lives in a house that is 26 years old.  There is wood and carpeting throughout the home.  They have gas heating and central cooling.  There is a dog inside of the home and does have the bed room.  There are dust mite covers on the bed, but not the pillows.  There is no tobacco exposure.  She does have a HEPA filter.  She does not [place] near interstate or industrial area. She is aiming to go to [employer].     Review of systems otherwise negative other than that mentioned in the  HPI.    Objective:   Blood pressure 108/68, pulse 89, temperature 98.1 F (36.7 C), temperature source Temporal, resp. rate 18, height 5' 7 (1.702 m), weight 196 lb (88.9 kg), SpO2 97%. Body mass index is 30.7 kg/m.     Physical Exam Vitals reviewed.  Constitutional:      Appearance: She is well-developed.  HENT:     Head: Normocephalic and atraumatic.     Right Ear: Tympanic membrane, ear canal and external ear normal. No drainage, swelling or tenderness. Tympanic membrane is not injected, scarred,  erythematous, retracted or bulging.     Left Ear: Tympanic membrane, ear canal and external ear normal. No drainage, swelling or tenderness. Tympanic membrane is not injected, scarred, erythematous, retracted or bulging.     Nose: No nasal deformity, septal deviation, mucosal edema or rhinorrhea.     Right Turbinates: Enlarged, swollen and pale.     Left Turbinates: Enlarged, swollen and pale.     Right Sinus: No maxillary sinus tenderness or frontal sinus tenderness.     Left Sinus: No maxillary sinus tenderness or frontal sinus tenderness.     Mouth/Throat:     Mouth: Mucous membranes are not pale and not dry.     Pharynx: Uvula midline.  Eyes:     General: Lids are normal. Allergic shiner present.        Right eye: No discharge.        Left eye: No discharge.     Conjunctiva/sclera: Conjunctivae normal.     Right eye: Right conjunctiva is not injected. No chemosis.    Left eye: Left conjunctiva is not injected. No chemosis.    Pupils: Pupils are equal, round, and reactive to light.  Cardiovascular:     Rate and Rhythm: Normal rate and regular rhythm.     Heart sounds: Normal heart sounds.  Pulmonary:     Effort: Pulmonary effort is normal. No tachypnea, accessory muscle usage or respiratory distress.     Breath sounds: Normal breath sounds. No wheezing, rhonchi or rales.  Chest:     Chest wall: No tenderness.  Abdominal:     Tenderness: There is no abdominal tenderness.  There is no guarding or rebound.  Lymphadenopathy:     Head:     Right side of head: No submandibular, tonsillar or occipital adenopathy.     Left side of head: No submandibular, tonsillar or occipital adenopathy.     Cervical: No cervical adenopathy.  Skin:    General: Skin is warm.     Capillary Refill: Capillary refill takes less than 2 seconds.     Coloration: Skin is not pale.     Findings: No abrasion, erythema, petechiae or rash. Rash is not papular, urticarial or vesicular.  Neurological:     Mental Status: She is alert.  Psychiatric:        Behavior: Behavior is cooperative.      Diagnostic studies:   Spirometry: Normal FEV1, FVC, and FEV1/FVC ratio. There is no scooping suggestive of obstructive disease.           Marty Shaggy, MD [facility]       "

## 2025-01-02 NOTE — Patient Instructions (Addendum)
 1. Seasonal and perennial allergic rhinitis - We will update testing at the next visit.  - We will see what this shows.   2. Mild intermittent asthma, uncomplicated - Lung testing looks great today. - Daily controller medication(s):  - Prior to physical activity: albuterol  2 puffs 10-15 minutes before physical activity. - Rescue medications: albuterol  4 puffs every 4-6 hours as needed - Asthma control goals:  * Full participation in all desired activities (may need albuterol  before activity) * Albuterol  use two time or less a week on average (not counting use with activity) * Cough interfering with sleep two time or less a month * Oral steroids no more than once a year * No hospitalizations  3. Pollen-food allergy syndrome - with propensity to have allergic reactions to fruit  - We will do testing at the next visit. - Sheet provided to circle the foods that you are interested in testing.  4. Return in about 2 weeks (around 01/16/2025) for SKIN TESTING. You can have the follow up appointment with Dr. Iva or a Nurse Practicioner (our Nurse Practitioners are excellent and always have Physician oversight!).    Please inform us  of any Emergency Department visits, hospitalizations, or changes in symptoms. Call us  before going to the ED for breathing or allergy symptoms since we might be able to fit you in for a sick visit. Feel free to contact us  anytime with any questions, problems, or concerns.  It was a pleasure to see you again today!  Websites that have reliable patient information: 1. American Academy of Asthma, Allergy, and Immunology: www.aaaai.org 2. Food Allergy Research and Education (FARE): foodallergy.org 3. Mothers of Asthmatics: http://www.asthmacommunitynetwork.org 4. American College of Allergy, Asthma, and Immunology: www.acaai.org      Like us  on [employer] and Instagram for our latest updates!      A healthy democracy works best when [employer] participate!  Make sure you are registered to vote! If you have moved or changed any of your contact information, you will need to get this updated before voting! Scan the QR codes below to learn more!

## 2025-01-11 ENCOUNTER — Ambulatory Visit: Admitting: Allergy & Immunology

## 2025-01-11 ENCOUNTER — Encounter: Payer: Self-pay | Admitting: Allergy & Immunology

## 2025-01-11 DIAGNOSIS — J3089 Other allergic rhinitis: Secondary | ICD-10-CM

## 2025-01-11 DIAGNOSIS — T7819XD Other adverse food reactions, not elsewhere classified, subsequent encounter: Secondary | ICD-10-CM

## 2025-01-11 DIAGNOSIS — R220 Localized swelling, mass and lump, head: Secondary | ICD-10-CM

## 2025-01-11 DIAGNOSIS — L299 Pruritus, unspecified: Secondary | ICD-10-CM

## 2025-01-11 DIAGNOSIS — J302 Other seasonal allergic rhinitis: Secondary | ICD-10-CM | POA: Diagnosis not present

## 2025-01-11 DIAGNOSIS — J4520 Mild intermittent asthma, uncomplicated: Secondary | ICD-10-CM

## 2025-01-11 NOTE — Progress Notes (Signed)
 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 Seasonal and perennial allergic rhinitis - failed montelukast as well as nasal sprays  - Testing today showed: grasses, ragweed, weeds, trees, dust mites, cat, dog, cockroach, and horse - Copy of test results provided.  - Avoidance measures provided. - Continue with: Claritin (loratadine) 10mg  tablet once daily - You can use an extra dose of the antihistamine, if needed, for breakthrough symptoms.  - Consider nasal saline rinses 1-2 times daily to remove allergens from the nasal cavities as well as help with mucous clearance (this is especially helpful to do before the nasal sprays are given) - Strongly consider allergy  shots as a means of long-term control. - Allergy  shots re-train and reset the immune system to ignore environmental allergens and decrease the resulting immune response to those allergens (sneezing, itchy watery eyes, runny nose, nasal congestion, etc).    - Allergy  shots improve symptoms in 75-85% of patients.  - CPT codes provided so you can check with insurance to make sure that they are covered.  - Allergy  shots can treat oral allergy  syndrome.   2. Mild intermittent asthma, uncomplicated - Lung testing looked great today.  - Daily controller medication(s):  - Prior to physical activity: albuterol  2 puffs 10-15 minutes before physical activity. - Rescue medications: albuterol  4 puffs every 4-6 hours as needed - Asthma control goals:  * Full participation in all desired activities (may need albuterol   before activity) * Albuterol  use two time or less a week on average (not counting use with activity) * Cough interfering with sleep two time or less a month * Oral steroids no more than once a year * No hospitalizations  3. 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 - I would avoid these since you have reacted to them in the past. - Try introducing the fruits that were negative and see how you do.  - Neffy  is up to date. - You still MIGHT have a reaction, so be careful.  - No testing is 100%. - Allergy  shots can certainly help with the oral allergy  syndrome.   4. Return in about 3 months (around 04/11/2025). You can have the follow up appointment with Dr. Iva or a Nurse Practicioner (our Nurse Practitioners are excellent and always have Physician oversight!).    Subjective:   Kari Carrillo is a 24 y.o. female presenting today for follow up of  Chief Complaint  Patient presents with   Allergy  Testing    1-55 and select foods    Kari Tallarico has a history of the following: Patient Active Problem List   Diagnosis Date Noted   Chronic otitis externa of right ear 12/12/2023   Chronic migraine without aura without status migrainosus, not intractable 12/10/2021   Mild intermittent asthma, uncomplicated 09/22/2021   Seasonal and perennial allergic rhinitis 09/22/2021   Allergic rhinitis 05/02/2021   Anxiety 05/02/2021   Asthma 05/02/2021   Eustachian tube disorder 05/02/2021   Pollen-food allergy  syndrome, subsequent encounter 05/02/2021  Tinnitus of left ear 05/02/2021   Gastroesophageal reflux disease without esophagitis 08/05/2015   Irritable bowel syndrome with diarrhea 08/05/2015    History obtained from: chart review and patient.  Discussed the use of AI scribe software for clinical note transcription with the patient and/or guardian, who  gave verbal consent to proceed.  Kari is a 24 y.o. female presenting for skin testing. She was last seen on January 13th. We could not do testing because her insurance company does not cover testing on the same day as a New Patient visit. She has been off of all antihistamines 3 days in anticipation of the testing.   Her symptoms are present year-round without specific seasonal exacerbation. She is currently off her Zyrtec.  Has tried some nose sprays without much success.  She has a lot of nasal congestion.  She has a scratchy throat when consuming peanuts, leading her to avoid them entirely. She also avoids shellfish due to her mother's allergies, although she can consume clam chowder without issues. She recalls having an itchy throat after consuming almonds years ago, which led her to stop eating them. She has a mild reaction to shrimp and carries an EpiPen  for emergencies.  She has not consumed cherries since she was 24 years old due to a previous reaction. She tested negative for allergies to other fruits, which may allow her to try them.  She is currently off antihistamines for testing purposes. She has not been exposed to molds and does not feel allergic to them.     Otherwise, there have been no changes to her past medical history, surgical history, family history, or social history.    Review of systems otherwise negative other than that mentioned in the HPI.    Objective:   There were no vitals taken for this visit. There is no height or weight on file to calculate BMI.    Physical exam deferred since this was a skin testing appointment only.   Diagnostic studies:    Allergy  Studies:     Airborne Adult Perc - 01/11/25 0903     Time Antigen Placed 9096    Allergen Manufacturer Jestine    Location Back    Number of Test 55    1. Control-Buffer 50% Glycerol Negative    2. Control-Histamine 2+    3. Bahia Negative    4. Bermuda 2+    5. Johnson 3+    6.  [place]  Blue 4+    7. Meadow Fescue 4+    8. Perennial Rye 4+    9. Timothy 4+    10. Ragweed Mix Negative    11. Cocklebur 2+    12. Plantain,  English 2+    13. Baccharis 2+    14. Dog Fennel Negative    15. Russian Thistle 3+    16. Lamb's Quarters 2+    17. Sheep Sorrell 2+    18. Rough Pigweed 3+    19. Marsh Elder, Rough 2+    20. Mugwort, Common 3+    21. Box, Elder 3+    22. Cedar, red 3+    23. Sweet Gum 3+    24. Pecan Pollen 2+    25. Pine Mix 2+    26. Walnut, Black Pollen 3+    27. Red Mulberry 3+    28. Ash Mix 4+    29. Birch Mix 3+    30. Beech American 3+    31. Cottonwood, Eastern 3+  32. Hickory, White 3+    33. Maple Mix 2+    34. Oak, Eastern Mix 3+    35. Sycamore Eastern 3+    36. Alternaria Alternata Negative    37. Cladosporium Herbarum Negative    38. Aspergillus Mix Negative    39. Penicillium Mix Negative    40. Bipolaris Sorokiniana (Helminthosporium) Negative    41. Drechslera Spicifera (Curvularia) Negative    42. Mucor Plumbeus Negative    43. Fusarium Moniliforme Negative    44. Aureobasidium Pullulans (pullulara) Negative    45. Rhizopus Oryzae Negative    46. Botrytis Cinera Negative    47. Epicoccum Nigrum Negative    48. Phoma Betae Negative    49. Dust Mite Mix 3+    50. Cat Hair 10,000 BAU/ml 3+    51.  Dog Epithelia 3+    52. Mixed Feathers Negative    53. Horse Epithelia 3+    54. Cockroach, German 3+    55. Tobacco Leaf Negative          Intradermal - 01/11/25 0926     Time Antigen Placed 9073    Allergen Manufacturer Jestine    Location Arm    Number of Test 7    Control Negative    Bahia 3+    Ragweed Mix 3+    Mold 1 Negative    Mold 2 Negative    Mold 3 Negative    Mold 4 Negative          Food Adult Perc - 01/11/25 0900     Time Antigen Placed 9096    Allergen Manufacturer Jestine    Location Back     Control-buffer 50% Glycerol Negative    Control-Histamine 2+    1. Peanut --   10 x 15   8.  Shellfish Mix --   5 x 7   11. Walnut Food Negative    12. Almond --   5 x 8   17. Coconut Negative    23. Shrimp --   2 x 4   50. Carrots --   5 x 20   55. Orange  Negative    56. Lemon Negative    57. Banana Negative    58. Apple Negative    59. Peach Negative    60. Strawberry Negative    61. Blueberry Negative    62. Cherry --   5 x 11   63. Cantaloupe Negative    64. Watermelon Negative    65. Pineapple Negative    69. Ginger Negative          Allergy  testing results were read and interpreted by myself, documented by clinical staff.      Marty Shaggy, MD  [facility]        "

## 2025-01-11 NOTE — Patient Instructions (Signed)
 1. Seasonal and perennial allergic rhinitis - failed montelukast as well as nasal sprays  - Testing today showed: grasses, ragweed, weeds, trees, dust mites, cat, dog, cockroach, and horse - Copy of test results provided.  - Avoidance measures provided. - Continue with: Claritin (loratadine) 10mg  tablet once daily - You can use an extra dose of the antihistamine, if needed, for breakthrough symptoms.  - Consider nasal saline rinses 1-2 times daily to remove allergens from the nasal cavities as well as help with mucous clearance (this is especially helpful to do before the nasal sprays are given) - Strongly consider allergy  shots as a means of long-term control. - Allergy  shots re-train and reset the immune system to ignore environmental allergens and decrease the resulting immune response to those allergens (sneezing, itchy watery eyes, runny nose, nasal congestion, etc).    - Allergy  shots improve symptoms in 75-85% of patients.  - CPT codes provided so you can check with insurance to make sure that they are covered.  - Allergy  shots can treat oral allergy  syndrome.   2. Mild intermittent asthma, uncomplicated - Lung testing looked great today.  - Daily controller medication(s):  - Prior to physical activity: albuterol  2 puffs 10-15 minutes before physical activity. - Rescue medications: albuterol  4 puffs every 4-6 hours as needed - Asthma control goals:  * Full participation in all desired activities (may need albuterol  before activity) * Albuterol  use two time or less a week on average (not counting use with activity) * Cough interfering with sleep two time or less a month * Oral steroids no more than once a year * No hospitalizations  3. 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 - I would avoid these since you have reacted to them in the past. - Try  introducing the fruits that were negative and see how you do.  - Neffy  is up to date. - You still MIGHT have a reaction, so be careful.  - No testing is 100%. - Allergy  shots can certainly help with the oral allergy  syndrome.   4. Return in about 3 months (around 04/11/2025). You can have the follow up appointment with Dr. Iva or a Nurse Practicioner (our Nurse Practitioners are excellent and always have Physician oversight!).    Please inform us  of any Emergency Department visits, hospitalizations, or changes in symptoms. Call us  before going to the ED for breathing or allergy  symptoms since we might be able to fit you in for a sick visit. Feel free to contact us  anytime with any questions, problems, or concerns.  It was a pleasure to see you again today!  Websites that have reliable patient information: 1. American Academy of Asthma, Allergy , and Immunology: www.aaaai.org 2. Food Allergy  Research and Education (FARE): foodallergy.org 3. Mothers of Asthmatics: http://www.asthmacommunitynetwork.org 4. [employer] of Allergy , Asthma, and Immunology: www.acaai.org      Like us  on [employer] and Instagram for our latest updates!      A healthy democracy works best when [employer] participate! Make sure you are registered to vote! If you have moved or changed any of your contact information, you will need to get this updated before voting! Scan the QR codes below to learn more!       Airborne Adult Perc - 01/11/25 0903     Time Antigen Placed 9096    Allergen Manufacturer Jestine    Location Back  Number of Test 55    1. Control-Buffer 50% Glycerol Negative    2. Control-Histamine 2+    3. Bahia Negative    4. Bermuda 2+    5. Johnson 3+    6. [place]  Blue 4+    7. Meadow Fescue 4+    8. Perennial Rye 4+    9. Timothy 4+    10. Ragweed Mix Negative    11. Cocklebur 2+    12. Plantain,  English 2+    13. Baccharis 2+    14. Dog Fennel Negative    15. Russian  Thistle 3+    16. Lamb's Quarters 2+    17. Sheep Sorrell 2+    18. Rough Pigweed 3+    19. Marsh Elder, Rough 2+    20. Mugwort, Common 3+    21. Box, Elder 3+    22. Cedar, red 3+    23. Sweet Gum 3+    24. Pecan Pollen 2+    25. Pine Mix 2+    26. Walnut, Black Pollen 3+    27. Red Mulberry 3+    28. Ash Mix 4+    29. Birch Mix 3+    30. Beech American 3+    31. Cottonwood, Eastern 3+    32. Hickory, White 3+    33. Maple Mix 2+    34. Oak, Eastern Mix 3+    35. Sycamore Eastern 3+    36. Alternaria Alternata Negative    37. Cladosporium Herbarum Negative    38. Aspergillus Mix Negative    39. Penicillium Mix Negative    40. Bipolaris Sorokiniana (Helminthosporium) Negative    41. Drechslera Spicifera (Curvularia) Negative    42. Mucor Plumbeus Negative    43. Fusarium Moniliforme Negative    44. Aureobasidium Pullulans (pullulara) Negative    45. Rhizopus Oryzae Negative    46. Botrytis Cinera Negative    47. Epicoccum Nigrum Negative    48. Phoma Betae Negative    49. Dust Mite Mix 3+    50. Cat Hair 10,000 BAU/ml 3+    51.  Dog Epithelia 3+    52. Mixed Feathers Negative    53. Horse Epithelia 3+    54. Cockroach, German 3+    55. Tobacco Leaf Negative          Intradermal - 01/11/25 0926     Time Antigen Placed 9073    Allergen Manufacturer Jestine    Location Arm    Number of Test 7    Control Negative    Bahia 3+    Ragweed Mix 3+    Mold 1 Negative    Mold 2 Negative    Mold 3 Negative    Mold 4 Negative          Food Adult Perc - 01/11/25 0900     Time Antigen Placed 9096    Allergen Manufacturer Jestine    Location Back     Control-buffer 50% Glycerol Negative    Control-Histamine 2+    1. Peanut --   10 x 15   8. Shellfish Mix --   5 x 7   11. Walnut Food Negative    12. Almond --   5 x 8   17. Coconut Negative    23. Shrimp --   2 x 4   50. Carrots --   5 x 20   55. Orange  Negative    56. Lemon Negative  57. Banana Negative     58. Apple Negative    59. Peach Negative    60. Strawberry Negative    61. Blueberry Negative    62. Cherry --   5 x 11   63. Cantaloupe Negative    64. Watermelon Negative    65. Pineapple Negative    69. Ginger Negative          Reducing Pollen Exposure  The American Academy of Allergy , Asthma and Immunology suggests the following steps to reduce your exposure to pollen during allergy  seasons.    Do not hang sheets or clothing out to dry; pollen may collect on these items. Do not mow lawns or spend time around freshly cut grass; mowing stirs up pollen. Keep windows closed at night.  Keep car windows closed while driving. Minimize morning activities outdoors, a time when pollen counts are usually at their highest. Stay indoors as much as possible when pollen counts or humidity is high and on windy days when pollen tends to remain in the air longer. Use air conditioning when possible.  Many air conditioners have filters that trap the pollen spores. Use a HEPA room air filter to remove pollen form the indoor air you breathe.  Control of Dog or Cat Allergen  Avoidance is the best way to manage a dog or cat allergy . If you have a dog or cat and are allergic to dog or cats, consider removing the dog or cat from the home. If you have a dog or cat but dont want to find it a new home, or if your family wants a pet even though someone in the household is allergic, here are some strategies that may help keep symptoms at bay:  Keep the pet out of your bedroom and restrict it to only a few rooms. Be advised that keeping the dog or cat in only one room will not limit the allergens to that room. Dont pet, hug or kiss the dog or cat; if you do, wash your hands with soap and water. High-efficiency particulate air (HEPA) cleaners run continuously in a bedroom or living room can reduce allergen levels over time. Regular use of a high-efficiency vacuum cleaner or a central vacuum can reduce  allergen levels. Giving your dog or cat a bath at least once a week can reduce airborne allergen.  Control of Dust Mite Allergen    Dust mites play a major role in allergic asthma and rhinitis.  They occur in environments with high humidity wherever human skin is found.  Dust mites absorb humidity from the atmosphere (ie, they do not drink) and feed on organic matter (including shed human and animal skin).  Dust mites are a microscopic type of insect that you cannot see with the naked eye.  High levels of dust mites have been detected from mattresses, pillows, carpets, upholstered furniture, bed covers, clothes, soft toys and any woven material.  The principal allergen of the dust mite is found in its feces.  A gram of dust may contain 1,000 mites and 250,000 fecal particles.  Mite antigen is easily measured in the air during house cleaning activities.  Dust mites do not bite and do not cause harm to humans, other than by triggering allergies/asthma.    Ways to decrease your exposure to dust mites in your home:  Encase mattresses, box springs and pillows with a mite-impermeable barrier or cover   Wash sheets, blankets and drapes weekly in hot water (130 F) with detergent  and dry them in a dryer on the hot setting.  Have the room cleaned frequently with a vacuum cleaner and a damp dust-mop.  For carpeting or rugs, vacuuming with a vacuum cleaner equipped with a high-efficiency particulate air (HEPA) filter.  The dust mite allergic individual should not be in a room which is being cleaned and should wait 1 hour after cleaning before going into the room. Do not sleep on upholstered furniture (eg, couches).   If possible removing carpeting, upholstered furniture and drapery from the home is ideal.  Horizontal blinds should be eliminated in the rooms where the person spends the most time (bedroom, study, television room).  Washable vinyl, roller-type shades are optimal. Remove all non-washable stuffed  toys from the bedroom.  Wash stuffed toys weekly like sheets and blankets above.   Reduce indoor humidity to less than 50%.  Inexpensive humidity monitors can be purchased at most hardware stores.  Do not use a humidifier as can make the problem worse and are not recommended.  Allergy  Shots  Allergies are the result of a chain reaction that starts in the immune system. Your immune system controls how your body defends itself. For instance, if you have an allergy  to pollen, your immune system identifies pollen as an invader or allergen. Your immune system overreacts by producing antibodies called Immunoglobulin E (IgE). These antibodies travel to cells that release chemicals, causing an allergic reaction.  The concept behind allergy  immunotherapy, whether it is received in the form of shots or tablets, is that the immune system can be desensitized to specific allergens that trigger allergy  symptoms. Although it requires time and patience, the payback can be long-term relief. Allergy  injections contain a dilute solution of those substances that you are allergic to based upon your skin testing and allergy  history.   How Do Allergy  Shots Work?  Allergy  shots work much like a vaccine. Your body responds to injected amounts of a particular allergen given in increasing doses, eventually developing a resistance and tolerance to it. Allergy  shots can lead to decreased, minimal or no allergy  symptoms.  There generally are two phases: build-up and maintenance. Build-up often ranges from three to six months and involves receiving injections with increasing amounts of the allergens. The shots are typically given once or twice a week, though more rapid build-up schedules are sometimes used.  The maintenance phase begins when the most effective dose is reached. This dose is different for each person, depending on how allergic you are and your response to the build-up injections. Once the maintenance dose is reached,  there are longer periods between injections, typically two to four weeks.  Occasionally doctors give cortisone-type shots that can temporarily reduce allergy  symptoms. These types of shots are different and should not be confused with allergy  immunotherapy shots.  Who Can Be Treated with Allergy  Shots?  Allergy  shots may be a good treatment approach for people with allergic rhinitis (hay fever), allergic asthma, conjunctivitis (eye allergy ) or stinging insect allergy .   Before deciding to begin allergy  shots, you should consider:   The length of allergy  season and the severity of your symptoms  Whether medications and/or changes to your environment can control your symptoms  Your desire to avoid long-term medication use  Time: allergy  immunotherapy requires a major time commitment  Cost: may vary depending on your insurance coverage  Allergy  shots for children age 61 and older are effective and often well tolerated. They might prevent the onset of new allergen sensitivities or  the progression to asthma.  Allergy  shots are not started on patients who are pregnant but can be continued on patients who become pregnant while receiving them. In some patients with other medical conditions or who take certain common medications, allergy  shots may be of risk. It is important to mention other medications you talk to your allergist.   What are the two types of build-ups offered:   RUSH or Rapid Desensitization -- one day of injections lasting from 8:30-4:30pm, injections every 1 hour.  Approximately half of the build-up process is completed in that one day.  The following week, normal build-up is resumed, and this entails ~16 visits either weekly or twice weekly, until reaching your maintenance dose which is continued weekly until eventually getting spaced out to every month for a duration of 3 to 5 years. The regular build-up appointments are nurse visits where the injections are administered,  followed by required monitoring for 30 minutes.    Traditional build-up -- weekly visits for 6 -12 months until reaching maintenance dose, then continue weekly until eventually spacing out to every 4 weeks as above. At these appointments, the injections are administered, followed by required monitoring for 30 minutes.     Either way is acceptable, and both are equally effective. With the rush protocol, the advantage is that less time is spent here for injections overall AND you would also reach maintenance dosing faster (which is when the clinical benefit starts to become more apparent). Not everyone is a candidate for rapid desensitization.   IF we proceed with the RUSH protocol, there are premedications which must be taken the day before and the day after the rush only (this includes antihistamines, steroids, and Singulair).  After the rush day, no prednisone  or Singulair is required, and we just recommend antihistamines taken on your injection day.  What Is An Estimate of the Costs?  If you are interested in starting allergy  injections, please check with your insurance company about your coverage for both allergy  vial sets and allergy  injections.  Please do so prior to making the appointment to start injections.  The following are CPT codes to give to your insurance company. These are the amounts we BILL to the insurance company, but the amount YOU WILL PAY and WE RECEIVE IS SUBSTANTIALLY LESS and depends on the contracts we have with different insurance companies.   Amount Billed to Insurance Two allergy  vial set  CPT 95165   $ 2400     Two injections   CPT 95117   $ 40  Regarding the allergy  injections, your co-pay may or may not apply with each injection, so please confirm this with your insurance company. When you start allergy  injections, 1 or 2 sets of vials are made based on your allergies.  Not all patients can be on one set of vials. A set of vials lasts 6 months to a year depending on  how quickly you can proceed with your build-up of your allergy  injections. Vials are personalized for each patient depending on their specific allergens.  How often are allergy  injection given during the build-up period?   Injections are given at least weekly during the build-up period until your maintenance dose is achieved. Per the doctor's discretion, you may have the option of getting allergy  injections two times per week during the build-up period. However, there must be at least 48 hours between injections. The build-up period is usually completed within 6-12 months depending on your ability to schedule injections and  for adjustments for reactions. When maintenance dose is reached, your injection schedule is gradually changed to every two weeks and later to every three weeks. Injections will then continue every 4 weeks. Usually, injections are continued for a total of 3-5 years.   When Will I Feel Better?  Some may experience decreased allergy  symptoms during the build-up phase. For others, it may take as long as 12 months on the maintenance dose. If there is no improvement after a year of maintenance, your allergist will discuss other treatment options with you.  If you arent responding to allergy  shots, it may be because there is not enough dose of the allergen in your vaccine or there are missing allergens that were not identified during your allergy  testing. Other reasons could be that there are high levels of the allergen in your environment or major exposure to non-allergic triggers like tobacco smoke.  What Is the Length of Treatment?  Once the maintenance dose is reached, allergy  shots are generally continued for three to five years. The decision to stop should be discussed with your allergist at that time. Some people may experience a permanent reduction of allergy  symptoms. Others may relapse and a longer course of allergy  shots can be considered.  What Are the Possible  Reactions?  The two types of adverse reactions that can occur with allergy  shots are local and systemic. Common local reactions include very mild redness and swelling at the injection site, which can happen immediately or several hours after. Report a delayed reaction from your last injection. These include arm swelling or runny nose, watery eyes or cough that occurs within 12-24 hours after injection. A systemic reaction, which is less common, affects the entire body or a particular body system. They are usually mild and typically respond quickly to medications. Signs include increased allergy  symptoms such as sneezing, a stuffy nose or hives.   Rarely, a serious systemic reaction called anaphylaxis can develop. Symptoms include swelling in the throat, wheezing, a feeling of tightness in the chest, nausea or dizziness. Most serious systemic reactions develop within 30 minutes of allergy  shots. This is why it is strongly recommended you wait in your doctors office for 30 minutes after your injections. Your allergist is trained to watch for reactions, and his or her staff is trained and equipped with the proper medications to identify and treat them.   Report to the nurse immediately if you experience any of the following symptoms: swelling, itching or redness of the skin, hives, watery eyes/nose, breathing difficulty, excessive sneezing, coughing, stomach pain, diarrhea, or light headedness. These symptoms may occur within 15-20 minutes after injection and may require medication.   Who Should Administer Allergy  Shots?  The preferred location for receiving shots is your prescribing allergists office. Injections can sometimes be given at another facility where the physician and staff are trained to recognize and treat reactions, and have received instructions by your prescribing allergist.  What if I am late for an injection?   Injection dose will be adjusted depending upon how many days or weeks you  are late for your injection.   What if I am sick?   Please report any illness to the nurse before receiving injections. She may adjust your dose or postpone injections depending on your symptoms. If you have fever, flu, sinus infection or chest congestion it is best to postpone allergy  injections until you are better. Never get an allergy  injection if your asthma is causing you problems. If your symptoms  persist, seek out medical care to get your health problem under control.  What If I am or Become Pregnant:  Women that become pregnant should schedule an appointment with [facility] before receiving any further allergy  injections.

## 2025-04-17 ENCOUNTER — Ambulatory Visit: Admitting: Allergy & Immunology
